# Patient Record
Sex: Female | Born: 1970
Health system: Southern US, Community
[De-identification: ages and names within clinical notes are randomized; demographics above are authoritative.]

## PROBLEM LIST (undated history)

## (undated) DIAGNOSIS — R609 Edema, unspecified: Secondary | ICD-10-CM

## (undated) DIAGNOSIS — Z8742 Personal history of other diseases of the female genital tract: Secondary | ICD-10-CM

## (undated) DIAGNOSIS — E785 Hyperlipidemia, unspecified: Secondary | ICD-10-CM

## (undated) DIAGNOSIS — R87629 Unspecified abnormal cytological findings in specimens from vagina: Secondary | ICD-10-CM

## (undated) DIAGNOSIS — Z8619 Personal history of other infectious and parasitic diseases: Secondary | ICD-10-CM

## (undated) DIAGNOSIS — A749 Chlamydial infection, unspecified: Principal | ICD-10-CM

## (undated) DIAGNOSIS — M51369 Other intervertebral disc degeneration, lumbar region without mention of lumbar back pain or lower extremity pain: Secondary | ICD-10-CM

## (undated) DIAGNOSIS — M5126 Other intervertebral disc displacement, lumbar region: Secondary | ICD-10-CM

## (undated) DIAGNOSIS — M5136 Other intervertebral disc degeneration, lumbar region: Secondary | ICD-10-CM

## (undated) DIAGNOSIS — N921 Excessive and frequent menstruation with irregular cycle: Secondary | ICD-10-CM

## (undated) DIAGNOSIS — I10 Essential (primary) hypertension: Secondary | ICD-10-CM

## (undated) HISTORY — DX: Essential (primary) hypertension: I10

## (undated) HISTORY — DX: Personal history of other diseases of the female genital tract: Z87.42

## (undated) HISTORY — DX: Other intervertebral disc degeneration, lumbar region without mention of lumbar back pain or lower extremity pain: M51.369

## (undated) HISTORY — DX: Hyperlipidemia, unspecified: E78.5

## (undated) HISTORY — DX: Chlamydial infection, unspecified: A74.9

## (undated) HISTORY — DX: Other intervertebral disc degeneration, lumbar region: M51.36

## (undated) HISTORY — DX: Excessive and frequent menstruation with irregular cycle: N92.1

## (undated) HISTORY — DX: Other intervertebral disc displacement, lumbar region: M51.26

## (undated) HISTORY — DX: Unspecified abnormal cytological findings in specimens from vagina: R87.629

## (undated) HISTORY — DX: Edema, unspecified: R60.9

## (undated) HISTORY — DX: Personal history of other infectious and parasitic diseases: Z86.19

---

## 1995-09-22 HISTORY — PX: TUBAL LIGATION: SHX77

## 2008-05-21 ENCOUNTER — Other Ambulatory Visit: Admission: RE | Admit: 2008-05-21 | Discharge: 2008-05-21 | Payer: Self-pay | Admitting: Obstetrics and Gynecology

## 2009-05-24 ENCOUNTER — Other Ambulatory Visit: Admission: RE | Admit: 2009-05-24 | Discharge: 2009-05-24 | Payer: Self-pay | Admitting: Obstetrics and Gynecology

## 2010-06-02 ENCOUNTER — Other Ambulatory Visit: Admission: RE | Admit: 2010-06-02 | Discharge: 2010-06-02 | Payer: Self-pay | Admitting: Obstetrics and Gynecology

## 2010-06-19 ENCOUNTER — Ambulatory Visit (HOSPITAL_COMMUNITY): Admission: RE | Admit: 2010-06-19 | Discharge: 2010-06-19 | Payer: Self-pay | Admitting: Obstetrics & Gynecology

## 2010-07-02 ENCOUNTER — Ambulatory Visit (HOSPITAL_COMMUNITY): Admission: RE | Admit: 2010-07-02 | Discharge: 2010-07-02 | Payer: Self-pay | Admitting: Obstetrics & Gynecology

## 2011-01-19 ENCOUNTER — Other Ambulatory Visit: Payer: Self-pay | Admitting: Obstetrics and Gynecology

## 2011-01-19 ENCOUNTER — Other Ambulatory Visit (HOSPITAL_COMMUNITY): Payer: Self-pay

## 2011-01-19 DIAGNOSIS — Z09 Encounter for follow-up examination after completed treatment for conditions other than malignant neoplasm: Secondary | ICD-10-CM

## 2011-02-11 ENCOUNTER — Encounter (HOSPITAL_COMMUNITY): Payer: Self-pay

## 2011-02-25 ENCOUNTER — Ambulatory Visit (HOSPITAL_COMMUNITY)
Admission: RE | Admit: 2011-02-25 | Discharge: 2011-02-25 | Disposition: A | Discharge status: Home / Self Care | Payer: 59 | Source: Ambulatory Visit | Attending: Obstetrics and Gynecology | Admitting: Obstetrics and Gynecology

## 2011-02-25 DIAGNOSIS — Z09 Encounter for follow-up examination after completed treatment for conditions other than malignant neoplasm: Secondary | ICD-10-CM

## 2011-02-25 DIAGNOSIS — R928 Other abnormal and inconclusive findings on diagnostic imaging of breast: Secondary | ICD-10-CM | POA: Insufficient documentation

## 2011-05-19 ENCOUNTER — Emergency Department (HOSPITAL_COMMUNITY)
Admission: EM | Admit: 2011-05-19 | Discharge: 2011-05-19 | Disposition: A | Discharge status: Home / Self Care | Payer: 59 | Attending: Emergency Medicine | Admitting: Emergency Medicine

## 2011-05-19 ENCOUNTER — Encounter: Payer: Self-pay | Admitting: *Deleted

## 2011-05-19 DIAGNOSIS — R071 Chest pain on breathing: Secondary | ICD-10-CM | POA: Insufficient documentation

## 2011-05-19 DIAGNOSIS — Y92410 Unspecified street and highway as the place of occurrence of the external cause: Secondary | ICD-10-CM | POA: Insufficient documentation

## 2011-05-19 DIAGNOSIS — R0789 Other chest pain: Secondary | ICD-10-CM

## 2011-05-19 MED ORDER — KETOROLAC TROMETHAMINE 30 MG/ML IJ SOLN
30.0000 mg | Freq: Once | INTRAMUSCULAR | Status: AC
  Administered 2011-05-19: 30 mg via INTRAVENOUS
  Filled 2011-05-19: qty 1

## 2011-05-19 MED ORDER — ONDANSETRON HCL 4 MG/2ML IJ SOLN
4.0000 mg | Freq: Once | INTRAMUSCULAR | Status: AC
  Administered 2011-05-19: 4 mg via INTRAVENOUS
  Filled 2011-05-19: qty 2

## 2011-05-19 MED ORDER — NAPROXEN 500 MG PO TABS: 500.0000 mg | ORAL_TABLET | Freq: Two times a day (BID) | ORAL | Status: AC

## 2011-05-19 MED ORDER — CYCLOBENZAPRINE HCL 10 MG PO TABS: 10.0000 mg | ORAL_TABLET | Freq: Two times a day (BID) | ORAL | Status: AC | PRN

## 2011-05-19 NOTE — Discharge Instructions (Signed)
Pain and chest wall is related to the underlying muscles. Will be sore for several days. Medications for pain and muscle spasm. Ice pack to sore areas. Followup your doctor
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
## 2011-06-04 ENCOUNTER — Encounter: Payer: Self-pay | Admitting: Family Medicine

## 2011-06-04 ENCOUNTER — Ambulatory Visit (INDEPENDENT_AMBULATORY_CARE_PROVIDER_SITE_OTHER): Payer: 59 | Admitting: Family Medicine

## 2011-06-04 ENCOUNTER — Other Ambulatory Visit (HOSPITAL_COMMUNITY)
Admission: RE | Admit: 2011-06-04 | Discharge: 2011-06-04 | Disposition: A | Discharge status: Home / Self Care | Payer: 59 | Source: Ambulatory Visit | Attending: Obstetrics and Gynecology | Admitting: Obstetrics and Gynecology

## 2011-06-04 VITALS — BP 120/80 | HR 77 | Resp 16 | Ht 62.0 in | Wt 232.8 lb

## 2011-06-04 DIAGNOSIS — Z01419 Encounter for gynecological examination (general) (routine) without abnormal findings: Secondary | ICD-10-CM | POA: Insufficient documentation

## 2011-06-04 DIAGNOSIS — M545 Low back pain, unspecified: Secondary | ICD-10-CM

## 2011-06-04 DIAGNOSIS — M542 Cervicalgia: Secondary | ICD-10-CM

## 2011-06-04 DIAGNOSIS — S20219A Contusion of unspecified front wall of thorax, initial encounter: Secondary | ICD-10-CM

## 2011-06-04 DIAGNOSIS — E669 Obesity, unspecified: Secondary | ICD-10-CM

## 2011-06-04 NOTE — Patient Instructions (Addendum)
For your breast, use ice if needed  For your neck and lower back, this is a muscle strain, you can continue the Naprosyn, use a heating pad on lower back and try stretching, if you are still having pain in 1 week call and I will order x-rays. For your weight , I recommend 30 minutes aerobic exercise 3x a week  Eat more veggies and low carb diet, decrease the sugar  Decrease the soda by 1 a day, decrease 1 tea a day, add water in its place  Eat at least 1 veggie or fruit with each meal Read on [employer]/ Phentermine  Please send a copy of your labs when you get your [place] labs done Return 8 weeks
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
## 2011-06-05 DIAGNOSIS — M545 Low back pain, unspecified: Secondary | ICD-10-CM | POA: Insufficient documentation

## 2011-06-05 DIAGNOSIS — M542 Cervicalgia: Secondary | ICD-10-CM | POA: Insufficient documentation

## 2011-06-05 DIAGNOSIS — S20219A Contusion of unspecified front wall of thorax, initial encounter: Secondary | ICD-10-CM | POA: Insufficient documentation

## 2011-06-05 NOTE — Assessment & Plan Note (Signed)
Lumbar strain no red flags on exam,for persistent pain will obtain images of lumbar spine
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
## 2011-06-05 NOTE — Assessment & Plan Note (Signed)
We discussed some of her eating habits as well as weight loss. At this time I want to see her get some effort toward changing her diet and exercise before prescribing any type of weight loss supplement

## 2011-07-27 ENCOUNTER — Encounter: Payer: Self-pay | Admitting: Family Medicine

## 2011-07-27 ENCOUNTER — Ambulatory Visit (INDEPENDENT_AMBULATORY_CARE_PROVIDER_SITE_OTHER): Payer: 59 | Admitting: Family Medicine

## 2011-07-27 VITALS — BP 108/68 | HR 83 | Resp 16 | Wt 232.1 lb

## 2011-07-27 DIAGNOSIS — S20219A Contusion of unspecified front wall of thorax, initial encounter: Secondary | ICD-10-CM

## 2011-07-27 DIAGNOSIS — M545 Low back pain, unspecified: Secondary | ICD-10-CM

## 2011-07-27 DIAGNOSIS — E669 Obesity, unspecified: Secondary | ICD-10-CM

## 2011-07-27 DIAGNOSIS — M542 Cervicalgia: Secondary | ICD-10-CM
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
## 2011-07-27 NOTE — Assessment & Plan Note (Signed)
Much improved patient no longer needing any muscle relaxants or anti-inflammatories.
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
## 2011-07-27 NOTE — Patient Instructions (Addendum)
We will set you up for a Mammogram  I will send you for an MRI of your back  Use the anti-inflammatories as needed  Bring a copy of your labs to the next visit
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
## 2011-08-14 ENCOUNTER — Ambulatory Visit (HOSPITAL_COMMUNITY): Payer: 59 | Attending: Family Medicine

## 2011-08-17 ENCOUNTER — Other Ambulatory Visit: Payer: Self-pay | Admitting: Family Medicine

## 2011-08-17 ENCOUNTER — Telehealth: Payer: Self-pay | Admitting: Family Medicine

## 2011-08-17 DIAGNOSIS — Z139 Encounter for screening, unspecified: Secondary | ICD-10-CM

## 2011-08-24 ENCOUNTER — Ambulatory Visit (HOSPITAL_COMMUNITY)
Admission: RE | Admit: 2011-08-24 | Discharge: 2011-08-24 | Disposition: A | Discharge status: Home / Self Care | Payer: 59 | Source: Ambulatory Visit | Attending: Family Medicine | Admitting: Family Medicine

## 2011-08-24 DIAGNOSIS — Z139 Encounter for screening, unspecified: Secondary | ICD-10-CM

## 2011-08-24 DIAGNOSIS — M5124 Other intervertebral disc displacement, thoracic region: Secondary | ICD-10-CM | POA: Insufficient documentation

## 2011-08-24 DIAGNOSIS — M545 Low back pain, unspecified: Secondary | ICD-10-CM

## 2011-08-24 DIAGNOSIS — M5126 Other intervertebral disc displacement, lumbar region: Secondary | ICD-10-CM | POA: Insufficient documentation

## 2011-09-29 ENCOUNTER — Ambulatory Visit (INDEPENDENT_AMBULATORY_CARE_PROVIDER_SITE_OTHER): Payer: 59 | Admitting: Family Medicine

## 2011-09-29 ENCOUNTER — Other Ambulatory Visit: Payer: Self-pay | Admitting: Family Medicine

## 2011-09-29 ENCOUNTER — Encounter: Payer: Self-pay | Admitting: Family Medicine

## 2011-09-29 ENCOUNTER — Ambulatory Visit (HOSPITAL_COMMUNITY)
Admission: RE | Admit: 2011-09-29 | Discharge: 2011-09-29 | Disposition: A | Discharge status: Home / Self Care | Payer: 59 | Source: Ambulatory Visit | Attending: Family Medicine | Admitting: Family Medicine

## 2011-09-29 VITALS — BP 112/78 | HR 82 | Resp 16 | Ht 62.0 in | Wt 230.4 lb

## 2011-09-29 DIAGNOSIS — M545 Low back pain, unspecified: Secondary | ICD-10-CM

## 2011-09-29 DIAGNOSIS — M542 Cervicalgia: Secondary | ICD-10-CM

## 2011-09-29 DIAGNOSIS — M5030 Other cervical disc degeneration, unspecified cervical region: Secondary | ICD-10-CM | POA: Insufficient documentation

## 2011-09-29 DIAGNOSIS — E669 Obesity, unspecified: Secondary | ICD-10-CM

## 2011-09-29 LAB — CBC
HCT: 38.3 % (ref 36.0–46.0)
Hemoglobin: 12.3 g/dL (ref 12.0–15.0)
MCV: 89.5 fL (ref 78.0–100.0)
RBC: 4.28 MIL/uL (ref 3.87–5.11)
WBC: 7.8 10*3/uL (ref 4.0–10.5)

## 2011-09-29 LAB — BASIC METABOLIC PANEL
Potassium: 4.4 mEq/L (ref 3.5–5.3)
Sodium: 137 mEq/L (ref 135–145)

## 2011-09-29 LAB — LIPID PANEL
Total CHOL/HDL Ratio: 4.2 Ratio
VLDL: 22 mg/dL (ref 0–40)

## 2011-09-29 MED ORDER — TRAMADOL HCL 50 MG PO TABS: 50.0000 mg | ORAL_TABLET | Freq: Three times a day (TID) | ORAL | Status: AC | PRN

## 2011-09-29 MED ORDER — DICLOFENAC SODIUM 1 % TD GEL: 1.0000 "application " | Freq: Four times a day (QID) | TRANSDERMAL | Status: DC | PRN

## 2011-09-29 NOTE — Patient Instructions (Signed)
For your back pain use the ultram for severe pain,  Use the voltaren gel You will be referred to physical therapy for your back and neck You will be referred to a spine specialist For your neck-get the x-rays, I will call with results We will call with results of your labs
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
## 2011-09-29 NOTE — Assessment & Plan Note (Signed)
Continue to encourage weight loss. Patient will did not have labs done at her job this year as they were not offering it this year. She will have fasting lipid panel performed

## 2011-09-29 NOTE — Assessment & Plan Note (Addendum)
Patient's neck pain is actually deteriorated since her last visit 2 months ago. I believe she is likely getting some strain and spasm however she does have tenderness over the C-spine therefore will obtain plain x-rays. I will plan to send her to a spine specialist for second opinion and review. In the meantime she will be given topical) gel and she did not respond very well mentally to muscle relaxants

## 2011-09-30 ENCOUNTER — Telehealth: Payer: Self-pay | Admitting: Family Medicine

## 2011-09-30 DIAGNOSIS — M545 Low back pain, unspecified: Secondary | ICD-10-CM

## 2011-09-30 DIAGNOSIS — M542 Cervicalgia: Secondary | ICD-10-CM
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
## 2011-10-07 ENCOUNTER — Ambulatory Visit (HOSPITAL_COMMUNITY)
Admission: RE | Admit: 2011-10-07 | Discharge: 2011-10-07 | Disposition: A | Discharge status: Home / Self Care | Payer: 59 | Source: Ambulatory Visit | Attending: Family Medicine | Admitting: Family Medicine

## 2011-10-07 DIAGNOSIS — M436 Torticollis: Secondary | ICD-10-CM | POA: Insufficient documentation

## 2011-10-07 DIAGNOSIS — IMO0001 Reserved for inherently not codable concepts without codable children: Secondary | ICD-10-CM | POA: Insufficient documentation

## 2011-10-07 DIAGNOSIS — M545 Low back pain, unspecified: Secondary | ICD-10-CM | POA: Insufficient documentation

## 2011-10-07 DIAGNOSIS — M5380 Other specified dorsopathies, site unspecified: Secondary | ICD-10-CM | POA: Insufficient documentation

## 2011-10-07 DIAGNOSIS — M6281 Muscle weakness (generalized): Secondary | ICD-10-CM | POA: Insufficient documentation

## 2011-10-07 DIAGNOSIS — M542 Cervicalgia: Secondary | ICD-10-CM | POA: Insufficient documentation
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
## 2011-10-12 ENCOUNTER — Ambulatory Visit (HOSPITAL_COMMUNITY)
Admission: RE | Admit: 2011-10-12 | Discharge: 2011-10-12 | Disposition: A | Discharge status: Home / Self Care | Payer: 59 | Source: Ambulatory Visit | Attending: Family Medicine | Admitting: Family Medicine

## 2011-10-12 DIAGNOSIS — M5380 Other specified dorsopathies, site unspecified: Secondary | ICD-10-CM

## 2011-10-12 DIAGNOSIS — M436 Torticollis: Secondary | ICD-10-CM

## 2011-10-12 NOTE — Progress Notes (Signed)
Physical Therapy Treatment Patient Details  Name: Gabrielle Richards MRN: [id number] Date of Birth: 19-Sep-1971 Today's Date: 10/12/2011 Time: 8657-8469 Time Calculation (min): 44 min Charges: 44 TE Visit#: 2  of 16   Re-eval: 11/06/11    Subjective: Symptoms/Limitations Symptoms: "Like a toothache this weekend neck and back"  Driving especially hurts neck.   Pain Assessment Currently in Pain?: Yes Pain Score:   5 Pain Location: Back Pain Orientation: Lower;Right Pain Type: Chronic pain Multiple Pain Sites: Yes  Exercise/Treatments Stretches Upper Trapezius Stretch: 2 reps;30 seconds;Limitations Upper Trapezius Stretch Limitations: bil Neck Exercises Neck Retraction: 10 reps;Seated Shoulder Extension: Both;Other reps (comment);Standing;Theraband Theraband Level (Shoulder Extension): Level 2 (Red) Row: Both;Other reps (comment);Theraband Theraband Level (Row): Level 2 (Red) Scapular Retraction: AROM;Both;10 reps;Seated Additional Neck Exercises UBE (Upper Arm Bike): 2' forward, 2' backwards   Stretches Single Knee to Chest Stretch: 2 reps;30 seconds;Limitations Single Knee to Chest Stretch Limitations: bil, have to do passively secondary to patient feels pulling into her neck when done with her own arms.   Lumbar Exercises Scapular Retraction: AROM;Both;10 reps;Seated Row: Both;Other reps (comment);Theraband Theraband Level (Row): Level 2 (Red) Shoulder Extension: Both;Other reps (comment);Standing;Theraband Theraband Level (Shoulder Extension): Level 2 (Red) Stability Bridge: 10 reps Ab Set: 10 reps;5 seconds Machine Exercises Tread Mill: 5' @ 0.8 mph UBE (Upper Arm Bike): 2' forward, 2' backwards   Physical Therapy Assessment and Plan Clinical Impression Statement: The patient was unable to perform most exercises within the full ROM, but could perform most in partial ROM.  She is going to heat at home her low back and her neck.   PT Plan: add lower trunk rotation  next session and bent knee raises, assess pain after last treatment and tolerance of HEP    Problem List Patient Active Problem List  Diagnoses  . Neck pain  . Lumbar back pain  . Obesity  . Muscle weakness (generalized)  . Limitation of joint motion of low back  . Limitation of joint motion of neck   PT Plan of Care PT Home Exercise Plan: see scanned report.    Rollene Rotunda Matilde Pottenger, PT, DPT  10/12/2011, 6:45 PM

## 2011-10-13 ENCOUNTER — Inpatient Hospital Stay (HOSPITAL_COMMUNITY): Admission: RE | Admit: 2011-10-13 | Payer: 59 | Source: Ambulatory Visit

## 2011-10-14 ENCOUNTER — Ambulatory Visit (HOSPITAL_COMMUNITY): Payer: 59 | Admitting: *Deleted

## 2011-10-19 ENCOUNTER — Ambulatory Visit (HOSPITAL_COMMUNITY)
Admission: RE | Admit: 2011-10-19 | Discharge: 2011-10-19 | Disposition: A | Discharge status: Home / Self Care | Payer: 59 | Source: Ambulatory Visit | Attending: Family Medicine | Admitting: Family Medicine

## 2011-10-19 DIAGNOSIS — M5380 Other specified dorsopathies, site unspecified: Secondary | ICD-10-CM

## 2011-10-19 DIAGNOSIS — M436 Torticollis: Secondary | ICD-10-CM

## 2011-10-19 NOTE — Progress Notes (Signed)
Physical Therapy Treatment Patient Details  Name: Gabrielle Richards MRN: [id number] Date of Birth: Mar 27, 1971  Today's Date: 10/19/2011 Time: 4098-1191 Time Calculation (min): 51 min Charges: 51 TE Visit#: 3  of 16   Re-eval: 11/06/11    Subjective: Symptoms/Limitations Symptoms: Has been doing HEP as able.   Pain Assessment Currently in Pain?: Yes Pain Score:   5 Pain Location: Back Pain Orientation: Right;Lower Pain Type: Chronic pain Pain Radiating Towards: none today  Exercise/Treatments Stretches Corner Stretch: 3 reps;10 seconds Neck Exercises Shoulder Extension: Both;Other reps (comment);Standing;Theraband Theraband Level (Shoulder Extension): Level 2 (Red) Row: Both;Other reps (comment);Theraband Theraband Level (Row): Level 2 (Red) Scapular Retraction: AROM;Both;10 reps;Seated Additional Neck Exercises UBE (Upper Arm Bike): 2' forward, 2' backwards   Stretches Passive Hamstring Stretch: 2 reps;30 seconds Single Knee to Chest Stretch: 2 reps;30 seconds;Limitations Single Knee to Chest Stretch Limitations: bil, have to do passively secondary to patient feels pulling into her neck when done with her own arms.   Lower Trunk Rotation: 5 reps;10 seconds Lumbar Exercises Scapular Retraction: AROM;Both;10 reps;Seated Row: Both;Other reps (comment);Theraband Theraband Level (Row): Level 2 (Red) Shoulder Extension: Both;Other reps (comment);Standing;Theraband Theraband Level (Shoulder Extension): Level 2 (Red) Stability Bridge: 10 reps Ab Set: 10 reps;5 seconds Machine Exercises Tread Mill: 5' @ 1.1 mph UBE (Upper Arm Bike): 2' forward, 2' backwards   Physical Therapy Assessment and Plan Clinical Impression Statement: The patient tolerated new ther ex well.  Wants to try to lose weight and get back into working out.  We calculated her target HR and will attempt to get to lower end of target HR next visit on the treadmill.   PT Plan: check to see if we can get into  target HR zone (106-140 bpm) on the treadmill next session.  Patient wants to try to start working out and would like to see how intense she has to go walking to reach her target HR.      Problem List Patient Active Problem List  Diagnoses  . Neck pain  . Lumbar back pain  . Obesity  . Muscle weakness (generalized)  . Limitation of joint motion of low back  . Limitation of joint motion of neck    General Behavior During Session: [facility] for tasks performed Cognition: [facility] for tasks performed PT Plan of Care PT Home Exercise Plan: no new  Jame Seelig B. Adriel Desrosier, PT, DPT  10/19/2011, 6:37 PM

## 2011-10-21 ENCOUNTER — Ambulatory Visit (HOSPITAL_COMMUNITY)
Admission: RE | Admit: 2011-10-21 | Discharge: 2011-10-21 | Disposition: A | Discharge status: Home / Self Care | Payer: 59 | Source: Ambulatory Visit | Attending: Family Medicine | Admitting: Family Medicine

## 2011-10-21 DIAGNOSIS — M5380 Other specified dorsopathies, site unspecified: Secondary | ICD-10-CM

## 2011-10-21 DIAGNOSIS — M436 Torticollis: Secondary | ICD-10-CM

## 2011-10-21 NOTE — Progress Notes (Signed)
Physical Therapy Treatment Patient Details  Name: Gabrielle Richards MRN: [id number] Date of Birth: 01-12-1971  Today's Date: 10/21/2011 Time: 4098-1191 Time Calculation (min): 50 min Visit#: 4  of 16   Re-eval: 11/06/11  Charge: therex 46 min  Subjective: Symptoms/Limitations Symptoms: R neck down back around spinal cord, pain scale 5/10.  Got a good workout last session Pain Assessment Currently in Pain?: Yes Pain Score:   5 Pain Location: Back  Objective:   Exercise/Treatments Stretches Active Hamstring Stretch: 3 reps;30 seconds;Limitations Active Hamstring Stretch Limitations: with rope Single Knee to Chest Stretch: 2 reps;30 seconds;Limitations Single Knee to Chest Stretch Limitations: bilateral with towel Lower Trunk Rotation: 5 reps;10 seconds Lumbar Exercises Scapular Retraction: Both;15 reps;Standing;Theraband Theraband Level (Scapular Retraction): Level 3 (Green) Row: Both;15 reps;Standing;Theraband Theraband Level (Row): Level 3 (Green) Shoulder Extension: Both;15 reps;Standing;Theraband Theraband Level (Shoulder Extension): Level 3 (Green) Stability Clam: Supine;10 reps;3 seconds;Limitations Clam Limitations: with ab sets Bridge: 15 reps;Limitations Bridge Limitations: with ab sets Ab Set: 10 reps;5 seconds Wall Slides: 5 seconds;5 reps;Limitations Wall Slides Limitations: with ab sets Machine Exercises Tread Mill: 6' @ 1.6; 3' in HR 121, 6' in HR=118 UBE (Upper Arm Bike): 4' backwards @ 1.5     Physical Therapy Assessment and Plan PT Assessment and Plan Clinical Impression Statement: Pt able to acheive HR of 121 on treadmill today.  Added new supine therex for core strengthening/stability with min cueing for technique.  Pt tolerated well towards total therex.   PT Plan: Continue treadmill for target HR (106-140 bpm), progress lumbar/cervical strength, begin prone hip ext and supine SLR with ab set next session.      Goals    Problem List Patient  Active Problem List  Diagnoses  . Neck pain  . Lumbar back pain  . Obesity  . Muscle weakness (generalized)  . Limitation of joint motion of low back  . Limitation of joint motion of neck    PT - End of Session Activity Tolerance: Patient tolerated treatment well General Behavior During Session: [facility] for tasks performed Cognition: [facility] for tasks performed  Juel Burrow 10/21/2011, 5:44 PM

## 2011-10-22 ENCOUNTER — Ambulatory Visit (HOSPITAL_COMMUNITY)
Admission: RE | Admit: 2011-10-22 | Discharge: 2011-10-22 | Disposition: A | Discharge status: Home / Self Care | Payer: 59 | Source: Ambulatory Visit | Attending: Family Medicine | Admitting: Family Medicine

## 2011-10-22 DIAGNOSIS — M5380 Other specified dorsopathies, site unspecified: Secondary | ICD-10-CM

## 2011-10-22 DIAGNOSIS — M436 Torticollis: Secondary | ICD-10-CM

## 2011-10-22 NOTE — Progress Notes (Signed)
Physical Therapy Treatment Patient Details  Name: THAMAR HOLIK MRN: [id number] Date of Birth: [birth year]  Today's Date: 10/22/2011 Time: 9528-4132 Time Calculation (min): 42 min Charges: 42 TE Visit#: 5  of 16   Re-eval: 11/06/11    Subjective: Symptoms/Limitations Symptoms: Sore after last session.  "She worked me hard" Pain Assessment Currently in Pain?: Yes Pain Score:   6 Pain Location: Back Pain Orientation: Right;Lower Pain Type: Chronic pain  Exercise/Treatments Neck Exercises Shoulder Extension: Both;15 reps;Standing;Theraband Theraband Level (Shoulder Extension): Level 3 (Green) Row: Both;15 reps;Standing;Theraband Theraband Level (Row): Level 3 (Green) Scapular Retraction: Both;15 reps;Standing;Theraband Theraband Level (Scapular Retraction): Level 3 (Green) Additional Neck Exercises UBE (Upper Arm Bike): 4' backwards @ 1.5 Stretches Active Hamstring Stretch: 30 seconds;Limitations;2 reps Active Hamstring Stretch Limitations: with rope right leg only Passive Hamstring Stretch: 2 reps;30 seconds Single Knee to Chest Stretch: 2 reps;30 seconds;Limitations Single Knee to Chest Stretch Limitations: bilateral with therapist's assist Lower Trunk Rotation: 5 reps;10 seconds Lumbar Exercises Scapular Retraction: Both;15 reps;Standing;Theraband Theraband Level (Scapular Retraction): Level 3 (Green) Row: Both;15 reps;Standing;Theraband Theraband Level (Row): Level 3 (Green) Shoulder Extension: Both;15 reps;Standing;Theraband Theraband Level (Shoulder Extension): Level 3 (Green) Stability Bridge: Limitations;10 reps Bridge Limitations: with ab sets Bent Knee Raise: 10 reps Straight Leg Raise: 5 reps;Supine;Limitations Straight Leg Raises Limitations: bil with ab set Leg Raise: Right;Left;5 reps;Limitations Leg Raises Limitations: prone over 1 pillow alternating legs.   Wall Slides: 5 reps;Limitations Wall Slides Limitations: with ab set 8 reps Machine  Exercises Tread Mill: 6' @ 1.6; 3' in HR 121, 6' in HR=118 UBE (Upper Arm Bike): 4' backwards @ 1.5  Physical Therapy Assessment and Plan Clinical Impression Statement: The patient tolerated new exercises well.  Did not do as much cervical exercises and stretches today.   PT Plan: Continue to do treadmill for target HR of 106-140 bpm, do more cervical strengthening next session (chin tucks, corner stretch, UT stretch).      Problem List Patient Active Problem List  Diagnoses  . Neck pain  . Lumbar back pain  . Obesity  . Muscle weakness (generalized)  . Limitation of joint motion of low back  . Limitation of joint motion of neck   General Behavior During Session: [facility] for tasks performed Cognition: [facility] for tasks performed PT Plan of Care PT Home Exercise Plan: no new  Cassey Hurrell B. Kruz Chiu, PT, DPT  10/22/2011, 6:59 PM

## 2011-10-26 ENCOUNTER — Ambulatory Visit (HOSPITAL_COMMUNITY)
Admission: RE | Admit: 2011-10-26 | Discharge: 2011-10-26 | Disposition: A | Discharge status: Home / Self Care | Payer: 59 | Source: Ambulatory Visit | Attending: Family Medicine | Admitting: Family Medicine

## 2011-10-26 DIAGNOSIS — M545 Low back pain, unspecified: Secondary | ICD-10-CM | POA: Insufficient documentation

## 2011-10-26 DIAGNOSIS — M6281 Muscle weakness (generalized): Secondary | ICD-10-CM | POA: Insufficient documentation

## 2011-10-26 DIAGNOSIS — IMO0001 Reserved for inherently not codable concepts without codable children: Secondary | ICD-10-CM | POA: Insufficient documentation

## 2011-10-26 DIAGNOSIS — M542 Cervicalgia: Secondary | ICD-10-CM | POA: Insufficient documentation

## 2011-10-26 NOTE — Progress Notes (Signed)
Physical Therapy Treatment Patient Details  Name: Gabrielle Richards MRN: [id number] Date of Birth: [birth year]  Today's Date: 10/26/2011 Time: 4098-1191 Time Calculation (min): 39 min Visit#: 6  of 16   Re-eval: 11/06/11 Charges:  therex 38'  Subjective: Symptoms/Limitations Symptoms: Pt. states she has worked out today at work; been [employer].  Pt states her neck is bothering her more than her back. Pain Assessment Currently in Pain?: Yes Pain Score:   4 Pain Location: Back Pain Orientation: Right;Lower   Exercise/Treatments Stretches Upper Trapezius Stretch: 2 reps;30 seconds;Limitations Upper Trapezius Stretch Limitations: bil Corner Stretch: 3 reps;30 seconds Neck Exercises Prone chin tuck head lift 10X Prone extension, and rows 10 X Prone alt UE lifts 5X each Shoulder Extension: Both;15 reps;Standing;Theraband Theraband Level (Shoulder Extension): Level 3 (Green) Row: Both;15 reps;Standing;Theraband Theraband Level (Row): Level 3 (Green) Scapular Retraction: Both;15 reps;Standing;Theraband Theraband Level (Scapular Retraction): Level 3 (Green) Stability Bridge: 15 reps;Limitations Bridge Limitations: with ab Archivist Knee Raise: 10 reps Machine Exercises Tread Mill: 8' 1.6-1. UBE (Upper Arm Bike): 4' backwards @ 1.5     Physical Therapy Assessment and Plan PT Assessment and Plan Clinical Impression Statement: Focused more on cervical exercises and stretches today.  Added prone strengthening exercises for scapular/cervical muscles.  Given tband and written instructions for HEP. PT Plan: Continue to work on pain reduction and progressing toward goals.    Problem List Patient Active Problem List  Diagnoses  . Neck pain  . Lumbar back pain  . Obesity  . Muscle weakness (generalized)  . Limitation of joint motion of low back  . Limitation of joint motion of neck    PT - End of Session Activity Tolerance: Patient tolerated treatment  well General Behavior During Session: [facility] for tasks performed Cognition: [facility] for tasks performed  Soha Thorup B. Bascom Levels, PTA 10/26/2011, 5:37 PM

## 2011-10-28 ENCOUNTER — Ambulatory Visit (HOSPITAL_COMMUNITY)
Admission: RE | Admit: 2011-10-28 | Discharge: 2011-10-28 | Disposition: A | Discharge status: Home / Self Care | Payer: 59 | Source: Ambulatory Visit | Attending: Family Medicine | Admitting: Family Medicine

## 2011-10-28 DIAGNOSIS — M5380 Other specified dorsopathies, site unspecified: Secondary | ICD-10-CM

## 2011-10-28 DIAGNOSIS — M436 Torticollis: Secondary | ICD-10-CM

## 2011-10-28 NOTE — Patient Instructions (Signed)
n

## 2011-10-28 NOTE — Progress Notes (Signed)
Physical Therapy Treatment Patient Details  Name: Gabrielle Richards MRN: [id number] Date of Birth: 1970-11-11  Today's Date: 10/28/2011 Time: 4098-1191 Time Calculation (min): 44 min Charges: 44TE Visit#: 7  of 16   Re-eval: 11/06/11    Subjective: Symptoms/Limitations Symptoms: "Please take it easy on me tonight" Patient is fatigued after a long day at work.   Pain Assessment Currently in Pain?: Yes Pain Score:   3 Pain Location: Back Pain Orientation: Right;Lower Pain Type: Chronic pain  Exercise/Treatments Stretches Upper Trapezius Stretch: 2 reps;Limitations;20 seconds Upper Trapezius Stretch Limitations: bil Corner Stretch: 2 reps;20 seconds (tighter mildly with left sidebend.  ) Neck Exercises Neck Retraction: Limitations Neck Retraction Limitations: prone chin tuck head lift 10X Shoulder Extension: Both;15 reps;Standing;Theraband Theraband Level (Shoulder Extension): Level 3 (Green) Row: Both;15 reps;Standing;Theraband Theraband Level (Row): Level 3 (Green) Scapular Retraction: Both;15 reps;Standing;Theraband Theraband Level (Scapular Retraction): Level 3 (Green) Upper Extremity Lift: Limitations Uppder Extremity Lift Limitations: alt UE lifts 1x10, w back lifts in prone 1 x 10 Additional Neck Exercises UBE (Upper Arm Bike): 2' backwards, 2' forwards @ 2.0  Stretches Passive Hamstring Stretch: 2 reps;30 seconds Single Knee to Chest Stretch: 2 reps;30 seconds;Limitations Single Knee to Chest Stretch Limitations: bilateral with therapist's assist Lower Trunk Rotation: 5 reps;10 seconds Lumbar Exercises Scapular Retraction: Both;15 reps;Standing;Theraband Theraband Level (Scapular Retraction): Level 3 (Green) Row: Both;15 reps;Standing;Theraband Theraband Level (Row): Level 3 (Green) Shoulder Extension: Both;15 reps;Standing;Theraband Theraband Level (Shoulder Extension): Level 3 (Green) Stability Bridge: 15 reps;Limitations Bridge Limitations: with ab  sets Bent Knee Raise: 15 reps Straight Leg Raise: 5 reps;Supine;Limitations Straight Leg Raises Limitations: bil with ab set hovering Leg Raise: Right;Left;Limitations;10 reps Machine Exercises Tread Mill: 6' @ 1.6-1.8  UBE (Upper Arm Bike): 2' backwards, 2' forwards @ 2.0  Physical Therapy Assessment and Plan Clinical Impression Statement: The patient is tolerating new exercises well.  She has increased her mobility in both hamstring flexibility and low back rotation as visibly observed by PT today.   PT Plan: Continue PRE for abdominal strength and cervical mobility and strength.  Start cervical isometrics and d/c t-band for scap secondary to patient should be pefroming for HEP at home.  Start lifting education and functional squats again.    Problem List Patient Active Problem List  Diagnoses  . Neck pain  . Lumbar back pain  . Obesity  . Muscle weakness (generalized)  . Limitation of joint motion of low back  . Limitation of joint motion of neck   PT - End of Session Activity Tolerance: Patient tolerated treatment well General Behavior During Session: [facility] for tasks performed Cognition: [facility] for tasks performed  [name] B. Britiney Blahnik, PT, DPT  10/28/2011, 7:17 PM

## 2011-11-02 ENCOUNTER — Ambulatory Visit (HOSPITAL_COMMUNITY)
Admission: RE | Admit: 2011-11-02 | Discharge: 2011-11-02 | Disposition: A | Discharge status: Home / Self Care | Payer: 59 | Source: Ambulatory Visit | Attending: Family Medicine | Admitting: Family Medicine

## 2011-11-02 NOTE — Progress Notes (Signed)
Physical Therapy Treatment Patient Details  Name: Gabrielle Richards MRN: [id number] Date of Birth: 12-18-1970  Today's Date: 11/02/2011 Time: 5621-3086 Time Calculation (min): 43 min Visit#: 8  of 16   Re-eval: 11/06/11 Charges: Therex x 38'  Subjective: Symptoms/Limitations Symptoms: It hurts the worst when I'm sitting. I feel like I'm getting better. Pain Assessment Currently in Pain?: Yes Pain Score:   5 Pain Location: Back Pain Orientation: Right;Lower   Exercise/Treatments Stretches Passive Hamstring Stretch: 2 reps;30 seconds Single Knee to Chest Stretch: 2 reps;30 seconds;Limitations Single Knee to Chest Stretch Limitations: bilateral with therapist's assist Lower Trunk Rotation: 5 reps;10 seconds Lumbar Exercises Scapular Retraction: Both;15 reps;Standing;Theraband Theraband Level (Scapular Retraction): Level 3 (Green) Row: Both;15 reps;Standing;Theraband Theraband Level (Row): Level 3 (Green) Shoulder Extension: Both;15 reps;Standing;Theraband Theraband Level (Shoulder Extension): Level 3 (Green) Stability Bridge: 15 reps;Limitations Bridge Limitations: with ab sets Bent Knee Raise: 15 reps Straight Leg Raise: 5 reps;Supine;Limitations Straight Leg Raises Limitations: bil with ab set hovering Single Arm Raise: 10 reps;Prone Leg Raise: 10 reps;Prone;Right;Single;Left Opposite Arm/Leg Raise: 5 reps;Prone Machine Exercises UBE (Upper Arm Bike): 2' backwards, 2' forwards @ 2.0  Physical Therapy Assessment and Plan PT Assessment and Plan Clinical Impression Statement: Pt completes therex well requiring minimal cueing for form. Pt is without c/o increase pain throughout session. Scapular t-band exercises completed to insure proper technique.Began prone opposite UE/LE lifts to improve back strength. Pt  reports pain decrease to 4/10 at end of session. PT Plan: Reassess next session.     Problem List Patient Active Problem List  Diagnoses  . Neck pain  . Lumbar  back pain  . Obesity  . Muscle weakness (generalized)  . Limitation of joint motion of low back  . Limitation of joint motion of neck    PT - End of Session Activity Tolerance: Patient tolerated treatment well General Behavior During Session: [facility] for tasks performed Cognition: [facility] for tasks performed  Seth Bake, PTA 11/02/2011, 5:38 PM

## 2011-11-04 ENCOUNTER — Ambulatory Visit (HOSPITAL_COMMUNITY)
Admission: RE | Admit: 2011-11-04 | Discharge: 2011-11-04 | Disposition: A | Discharge status: Home / Self Care | Payer: 59 | Source: Ambulatory Visit | Attending: Family Medicine | Admitting: Family Medicine

## 2011-11-04 DIAGNOSIS — M5380 Other specified dorsopathies, site unspecified: Secondary | ICD-10-CM

## 2011-11-04 DIAGNOSIS — M436 Torticollis: Secondary | ICD-10-CM

## 2011-11-04 NOTE — Evaluation (Signed)
Physical Therapy Re-Evaluation  Patient Details  Name: Gabrielle Richards MRN: [id number] Date of Birth: January 11, 1971  Today's Date: 11/04/2011 Time: 4098-1191 Time Calculation (min): 47 min Charges: 1 re-eval, 1 MMT, 1 ROM, 10 TE Visit#: 9  of 16   Re-eval: 12/09/11  Assessment Diagnosis: low back and neck pain  Past Surgical History:  Past Surgical History  Procedure Date  . Tubal ligation    Subjective Symptoms/Limitations Symptoms: Having a bad day, overall neck and back Pain Assessment Currently in Pain?: Yes Pain Score:   5 Pain Location: Neck Pain Orientation: Right;Lower Pain Type: Chronic pain Effect of Pain on Daily Activities: driving is still bothering her, at work towards the end of the day pain increases.    Assessment: last filed values = ( ) RUE Strength Right Shoulder Flexion:4+/5 no pulling (4/5 painful pulling) Right Shoulder ABduction:4+/5, no painful pulling (4/5 painful pulling) LUE Assessment LUE Assessment: Within Functional Limits [facility]) RLE Strength Right Hip Flexion:4-/5 (3+/5) Right Hip Extension: 4+/5 (4/5) Right Hip ABduction: 5/5 (5/5) Right Knee Flexion: 4/5 (5/5) Right Knee Extension: 4/5 (5/5) Right Ankle Dorsiflexion: 4/5 (5/5) Right Ankle Plantar Flexion: 5/5 (5/5) LLE Strength Left Hip Flexion:4+/5 (4/5) Left Hip Extension: 4/5 (4/5) Left Hip ABduction: 5/5 (5/5) Left Knee Flexion: 5/5 (5/5) Left Knee Extension: 5/5 (5/5) Left Ankle Dorsiflexion: 5/5 (5/5) Left Ankle Plantar Flexion: 5/5 Cervical AROM Cervical Flexion: decreased 10 % "a little strain" right middle neck (decreased 10%) Cervical Extension: decreased 50% pain anterior right neck. (decreased 10%) Cervical - Right Side Bend: decreased 10% some pressure on the right side (decreased 75%) Cervical - Left Side Bend: WFL no pain (decreased 50%) Cervical - Right Rotation: Decreased 5% most painful motion (decreased 50%) Cervical - Left Rotation: Decreased 5 % " a little  pull" on the right (decreased 10%) Cervical Strength Cervical Flexion: 3+/5 (3+/5) Cervical Extension: 4+/5 (3+/5) Cervical - Right Side Bend: 3+/5 (3+/5) Cervical - Left Side Bend: 4-/5 (3+/5) Cervical - Right Rotation: 4/5 (3/5) Cervical - Left Rotation: 4/5 Lumbar AROM Lumbar Flexion: decreased 5% "minor" pain (decreased 50%) Lumbar Extension: decreased 5% "center/right sting" at end ROM (decreased 25%) Lumbar - Right Side Bend: Decreased 25% (decreased 30%) Lumbar - Left Side Bend: Decreased 10% (decreased 25%) Lumbar - Right Rotation: Decreased 50% right sided rib pain (decreased 40%) Lumbar - Left Rotation: Decreased 75% (decreased 30%) Lumbar Strength Lumbar Flexion: 4/5 (NT) Lumbar Extension: 3/5 (NT)  Exercise/Treatments Machine Exercises Tread Mill: 6' @ 2.0  UBE (Upper Arm Bike): 4' backwards @ 1.0  Physical Therapy Assessment and Plan Clinical Impression Statement: Re-eval performed.  The patient has improved in both strength and ROM throughout her neck an lumbar spine.  She continues to have pain at 4-5/10 average intensity level daily and continues to be limited in her functional ablilty and her ability to workout.  She has attended 9 PT sessions and has met 4/4 STGs and  0/6 LTGs so far.   Rehab Potential: Good PT Frequency: Min 2X/week PT Duration: 4 weeks PT Treatment/Interventions: Functional mobility training;Therapeutic activities;Therapeutic exercise;Balance training;Neuromuscular re-education;Patient/family education;Other (comment) (modalities PRN for pain) PT Plan: continue 2 xs/wk x 4 more weeks, add cervical isometrics and functional squats starting lifting education next session.    Goals Home Exercise Program Pt will Perform Home Exercise Program: Independently Met PT Short Term Goals PT Short Term Goal 1: The patient will report max neck and back pain as a 5/10 or less to increase QOL and tolerane of functional activities  PT Short Term Goal 1 -  Progress: Met (4-5/10 ) PT Short Term Goal 2: The pateint will increase lumbar and cervical ROM by 10% (where applicable) throughout to increase mobility and ability to check her blind spot while driving. PT Short Term Goal 2 - Progress: Met PT Short Term Goal 3: The patient will increase her hip extension and hip flexion strength to at least 4/5 to show improved leg strength and tolerance of work tasks PT Short Term Goal 3 - Progress: Met PT Long Term Goals PT Long Term Goal 1: The pateint will report a maximal weekly pain of less than or equal to 3/10 in her low back and neck to show improved QOL and tolerance of functional activities PT Long Term Goal 1 - Progress: Progressing toward goal PT Long Term Goal 2: The patient will increase core strength to greater than or equal to 4/5 to show increased spinal stability and strength PT Long Term Goal 2 - Progress: Partly met Long Term Goal 3: The patient will increase her neck strength to at least 4/5 to show improved cervical stability and strength. Long Term Goal 3 Progress: Partly met Long Term Goal 4: The patient will increase cervical ROM to within 5% of normal limits to show improved mobility Long Term Goal 4 Progress: Partly met PT Long Term Goal 5: The patient will increase lumbar ROM to within 5% of normal to show imroved mobility Long Term Goal 5 Progress: Partly met Additional PT Long Term Goals?: Yes PT Long Term Goal 6: The pateint will be able to demonstrate correct lifting of an empty box 10 times to show improved tolerance of lifting equipment and supplies at work as well as retaining teaching of correct lifting techniques Long Term Goal 6 Progress: Other (comment) (not tested yet)  Problem List Patient Active Problem List  Diagnoses  . Neck pain  . Lumbar back pain  . Obesity  . Muscle weakness (generalized)  . Limitation of joint motion of low back  . Limitation of joint motion of neck    PT - End of Session Activity  Tolerance: Patient tolerated treatment well General Behavior During Session: [facility] for tasks performed Cognition: [facility] for tasks performed PT Plan of Care PT Home Exercise Plan: no new  Rukia Mcgillivray B. Jahmai Finelli, PT, DPT  11/04/2011, 7:19 PM  Physician Documentation Your signature is required to indicate approval of the treatment plan as stated above.  Please sign and either send electronically or make a copy of this report for your files and return this physician signed original.   Please mark one 1.__approve of plan  2. ___approve of plan with the following conditions.   ______________________________                                                          _____________________ Physician Signature  Date  

## 2011-11-05 ENCOUNTER — Telehealth: Payer: Self-pay | Admitting: Family Medicine

## 2011-11-05 NOTE — Telephone Encounter (Signed)
Noted, will have extension on therapy

## 2011-11-11 ENCOUNTER — Ambulatory Visit (HOSPITAL_COMMUNITY)
Admission: RE | Admit: 2011-11-11 | Discharge: 2011-11-11 | Disposition: A | Discharge status: Home / Self Care | Payer: 59 | Source: Ambulatory Visit | Attending: Family Medicine | Admitting: Family Medicine

## 2011-11-11 DIAGNOSIS — M436 Torticollis: Secondary | ICD-10-CM

## 2011-11-11 DIAGNOSIS — M5380 Other specified dorsopathies, site unspecified: Secondary | ICD-10-CM

## 2011-11-11 NOTE — Progress Notes (Signed)
Physical Therapy Treatment Patient Details  Name: Gabrielle Richards MRN: [id number] Date of Birth: 09-Mar-1971  Today's Date: 11/11/2011 Time: 4098-1191 Time Calculation (min): 53 min Visit#: 10  of 16   Re-eval: 12/09/11  Charge: therex: 43 min  Subjective: Symptoms/Limitations Symptoms: Intermittent pain R neck 5/10, LBP 4/10. Pain Assessment Currently in Pain?: Yes Pain Score:   4 Pain Location: Back  Objective:   Exercise/Treatments Neck Exercises Neck Lateral Flexion - Right: 5 reps;Seated;Limitations Neck Lateral Flexion - Right Limitations: isometric Neck Lateral Flexion - Left: 5 reps;Seated;Limitations Neck Lateral Flexion - Left Limitations: isometric Neck Rotation - Right: 5 reps;Seated;Limitations Neck Rotation - Right Limitations: isometric Neck Rotation - Left: 5 reps;Seated;Limitations Neck Rotation - Left Limitations: isometric Shoulder Extension: Both;15 reps;Standing;Theraband Theraband Level (Shoulder Extension): Level 3 (Green) Row: Both;15 reps;Standing;Theraband Theraband Level (Row): Level 3 (Green) Scapular Retraction: Both;15 reps;Standing;Theraband Theraband Level (Scapular Retraction): Level 3 (Green) Additional Neck Exercises UBE (Upper Arm Bike): 4' backwards @ 2.0  Stretches Passive Hamstring Stretch: 3 reps;30 seconds Single Knee to Chest Stretch: 5 reps;20 seconds Single Knee to Chest Stretch Limitations: bilateral with therapist's assist Lower Trunk Rotation: 5 reps;10 seconds Lumbar Exercises Scapular Retraction: Both;15 reps;Standing;Theraband Theraband Level (Scapular Retraction): Level 3 (Green) Row: Both;15 reps;Standing;Theraband Theraband Level (Row): Level 3 (Green) Shoulder Extension: Both;15 reps;Standing;Theraband Theraband Level (Shoulder Extension): Level 3 (Green) Stability Clam: Supine;10 reps;4 seconds Clam Limitations: with ab sets Bridge: 15 reps;Limitations Bridge Limitations: with ab sets Bent Knee Raise: 15  reps;Limitations Bent Knee Raise Limitations: with ab sets Heel Squeeze: Prone;5 reps;5 seconds Opposite Arm/Leg Raise: Prone;15 reps Functional Squats: 10 reps;Limitations Functional Squats Limitations: with manual assistance/demonstration Machine Exercises Tread Mill: 6' @ 2.0  UBE (Upper Arm Bike): 4' backwards @ 2.0     Physical Therapy Assessment and Plan PT Assessment and Plan Clinical Impression Statement: Began functional squats with demonstration and manual assistance for proper form, pt able to complete correclty following cueing.  Added isometric cervical strengthening exercises without difficulty.  Pt tolerated well towards total treatment. PT Plan: Continue with current POC, begin box lifting when functional squats are performed with good form.      Goals    Problem List Patient Active Problem List  Diagnoses  . Neck pain  . Lumbar back pain  . Obesity  . Muscle weakness (generalized)  . Limitation of joint motion of low back  . Limitation of joint motion of neck    PT - End of Session Activity Tolerance: Patient tolerated treatment well General Behavior During Session: [facility] for tasks performed Cognition: [facility] for tasks performed  Juel Burrow, PTA 11/11/2011, 5:49 PM

## 2011-11-13 NOTE — Telephone Encounter (Signed)
Pt aware.

## 2011-11-17 ENCOUNTER — Ambulatory Visit (HOSPITAL_COMMUNITY)
Admission: RE | Admit: 2011-11-17 | Discharge: 2011-11-17 | Disposition: A | Discharge status: Home / Self Care | Payer: 59 | Source: Ambulatory Visit | Attending: Family Medicine | Admitting: Family Medicine

## 2011-11-17 NOTE — Progress Notes (Signed)
Physical Therapy Treatment Patient Details  Name: Gabrielle Richards MRN: [id number] Date of Birth: January 15, 1971  Today's Date: 11/17/2011 Time: 1308-6578 Time Calculation (min): 49 min Visit#: 11  of 16   Re-eval: 12/09/11 Charges:  therex 38'  Subjective: Symptoms/Limitations Symptoms: Pt. states her pain is the same as last visit; 5/10 neck/4/10 LBP Pain Assessment Currently in Pain?: Yes Pain Score:   4 Pain Location: Back Pain Orientation: Right;Lower   Exercise/Treatments Stretches Passive Hamstring Stretch: 3 reps;30 seconds Single Knee to Chest Stretch: 5 reps;20 seconds Single Knee to Chest Stretch Limitations: bilateral with therapist's assist Lower Trunk Rotation: 5 reps;10 seconds Lumbar Exercises Scapular Retraction: Both;15 reps;Standing;Theraband Theraband Level (Scapular Retraction): Level 3 (Green) Row: Both;15 reps;Standing;Theraband Theraband Level (Row): Level 3 (Green) Shoulder Extension: Both;15 reps;Standing;Theraband Theraband Level (Shoulder Extension): Level 3 (Green) Stability Clam: Supine;10 reps;4 seconds Clam Limitations: with ab sets Bridge: 15 reps;Limitations Bridge Limitations: with ab sets Bent Knee Raise: 15 reps;Limitations Bent Knee Raise Limitations: with ab sets Straight Leg Raise: 10 reps Straight Leg Raises Limitations: bilateral Heel Squeeze: Prone;10 reps;5 seconds Single Arm Raise: Prone;10 reps Leg Raise: Prone;10 reps Opposite Arm/Leg Raise: Prone;10 reps Functional Squats: 10 reps;Limitations Functional Squats Limitations: no manual Assist needed Machine Exercises Tread Mill: 6' @ 2.0  UBE (Upper Arm Bike): 4' backwards @ 2.0   Physical Therapy Assessment and Plan PT Assessment and Plan Clinical Impression Statement: Pt. able to demonstrate squats correctly with good form today; able to complete all other exercises with improving stability. PT Plan: Continue to progress; begin lifting task using proper body mechanics  next visit.     Problem List Patient Active Problem List  Diagnoses  . Neck pain  . Lumbar back pain  . Obesity  . Muscle weakness (generalized)  . Limitation of joint motion of low back  . Limitation of joint motion of neck    PT - End of Session Activity Tolerance: Patient tolerated treatment well General Behavior During Session: [facility] for tasks performed Cognition: [facility] for tasks performed   Thorin Starner B. Bascom Levels, PTA 11/17/2011, 5:40 PM

## 2011-11-19 ENCOUNTER — Ambulatory Visit (HOSPITAL_COMMUNITY)
Admission: RE | Admit: 2011-11-19 | Discharge: 2011-11-19 | Disposition: A | Discharge status: Home / Self Care | Payer: 59 | Source: Ambulatory Visit | Attending: Family Medicine | Admitting: Family Medicine

## 2011-11-19 NOTE — Progress Notes (Signed)
Physical Therapy Treatment Patient Details  Name: Gabrielle Richards MRN: [id number] Date of Birth: May 15, 1971  Today's Date: 11/19/2011 Time: 4098-1191 Time Calculation (min): 42 min Visit#: 12  of 16   Re-eval: 12/09/11 Charges:  therex 32'    Subjective: Symptoms/Limitations Symptoms: Pt. reports she was sore after last visit but now her pain is same as last visit. Pain Assessment Currently in Pain?: Yes Pain Score:   4 Pain Location: Back Pain Orientation: Right;Lower   Exercise/Treatments Stretches Passive Hamstring Stretch: 3 reps;30 seconds Lumbar Exercises Scapular Retraction: Both;15 reps;Standing;Theraband Theraband Level (Scapular Retraction): Level 3 (Green) Row: Both;15 reps;Standing;Theraband Theraband Level (Row): Level 3 (Green) Shoulder Extension: Both;15 reps;Standing;Theraband Theraband Level (Shoulder Extension): Level 3 (Green) Stability Clam: Supine;15 reps Clam Limitations: with ab sets Bridge: 20 reps Bridge Limitations: with ab Archivist Knee Raise: 20 reps Bent Knee Raise Limitations: with ab sets Straight Leg Raise: 10 reps Straight Leg Raises Limitations: bilateral Heel Squeeze: Prone;15 reps;5 seconds Single Arm Raise: Prone;10 reps Leg Raise: Prone;10 reps Opposite Arm/Leg Raise: Prone;10 reps Lifting: From 12";5 reps;Weights Lifting Weights (lbs): 8# box Machine Exercises Tread Mill: 6' @ 2.0  UBE (Upper Arm Bike): 4' backwards @ 2.0     Physical Therapy Assessment and Plan PT Assessment and Plan Clinical Impression Statement: Began lifting education/body mechanics with manual/VC's for form and technique.  Able to increase reps today without difficulty.  Pt. needing less cues with therex. PT Plan: Continue X 2 more weeks.  Progress core stability.     Problem List Patient Active Problem List  Diagnoses  . Neck pain  . Lumbar back pain  . Obesity  . Muscle weakness (generalized)  . Limitation of joint motion of low back  .  Limitation of joint motion of neck    PT - End of Session Activity Tolerance: Patient tolerated treatment well General Behavior During Session: [facility] for tasks performed Cognition: [facility] for tasks performed    Amy B. Bascom Levels, PTA 11/19/2011, 5:17 PM

## 2011-11-23 ENCOUNTER — Ambulatory Visit (HOSPITAL_COMMUNITY)
Admission: RE | Admit: 2011-11-23 | Discharge: 2011-11-23 | Disposition: A | Discharge status: Home / Self Care | Payer: 59 | Source: Ambulatory Visit | Attending: Family Medicine | Admitting: Family Medicine

## 2011-11-23 DIAGNOSIS — M542 Cervicalgia: Secondary | ICD-10-CM | POA: Insufficient documentation

## 2011-11-23 DIAGNOSIS — M6281 Muscle weakness (generalized): Secondary | ICD-10-CM | POA: Insufficient documentation

## 2011-11-23 DIAGNOSIS — IMO0001 Reserved for inherently not codable concepts without codable children: Secondary | ICD-10-CM | POA: Insufficient documentation

## 2011-11-23 DIAGNOSIS — M545 Low back pain, unspecified: Secondary | ICD-10-CM | POA: Insufficient documentation

## 2011-11-23 NOTE — Progress Notes (Signed)
Physical Therapy Treatment Patient Details  Name: Gabrielle Richards MRN: [id number] Date of Birth: 09-30-1970  Today's Date: 11/23/2011 Time: 3295-1884 Time Calculation (min): 43 min Visit#: 13  of 16   Re-eval: 12/03/11 Charges:  therex 32'    Subjective: Symptoms/Limitations Symptoms: Pt. reports she was sore again after last visit but feeling better today.  Currently 4 in both areas (neck and back) Pain Assessment Currently in Pain?: Yes Pain Score:   4 Pain Location: Back Pain Orientation: Right;Lower   Exercise/Treatments Lumbar Exercises Scapular Retraction: 15 reps Theraband Level (Scapular Retraction): Level 4 (Blue) Row: Both;15 reps;Standing;Theraband Theraband Level (Row): Level 4 (Blue) Shoulder Extension: Both;15 reps;Standing;Theraband Theraband Level (Shoulder Extension): Level 4 (Blue) Stability Clam: Supine;15 reps Clam Limitations: with ab sets Bridge: 20 reps Bridge Limitations: with ab Archivist Knee Raise: 20 reps Bent Knee Raise Limitations: with ab sets Large Ball Abdominal Isometric: 10 reps Large Ball Oblique Isometric: 10 reps Straight Leg Raise: 15 reps Straight Leg Raises Limitations: bilateral Heel Squeeze: Prone;20 reps Single Arm Raise: Prone;20 reps Leg Raise: Prone;20 reps Opposite Arm/Leg Raise: Prone;20 reps Machine Exercises Tread Mill: 6' @ 2.0  UBE (Upper Arm Bike): 4' backwards @ 2.0     Physical Therapy Assessment and Plan PT Assessment and Plan Clinical Impression Statement: Began ball exercises for core strengthening; Able to increase reps today without difficulty. PT Plan: Progress bridging with ball or single leg next visit.  Resume squats/or wallslides and review lifting techniques.     Problem List Patient Active Problem List  Diagnoses  . Neck pain  . Lumbar back pain  . Obesity  . Muscle weakness (generalized)  . Limitation of joint motion of low back  . Limitation of joint motion of neck    PT - End of  Session Activity Tolerance: Patient tolerated treatment well General Behavior During Session: [facility] for tasks performed Cognition: [facility] for tasks performed    Lucinda Spells B. Bascom Levels, PTA 11/23/2011, 5:36 PM

## 2011-11-25 ENCOUNTER — Ambulatory Visit (HOSPITAL_COMMUNITY)
Admission: RE | Admit: 2011-11-25 | Discharge: 2011-11-25 | Disposition: A | Discharge status: Home / Self Care | Payer: 59 | Source: Ambulatory Visit | Attending: Family Medicine | Admitting: Family Medicine

## 2011-11-25 DIAGNOSIS — M5380 Other specified dorsopathies, site unspecified: Secondary | ICD-10-CM

## 2011-11-25 DIAGNOSIS — M436 Torticollis: Secondary | ICD-10-CM

## 2011-11-25 NOTE — Progress Notes (Signed)
Physical Therapy Treatment Patient Details  Name: Gabrielle Richards MRN: [id number] Date of Birth: [birth year]  Today's Date: 11/25/2011 Time: 2130-8657 Time Calculation (min): 45 min Visit#: 14  of 18   Re-eval: 12/03/11  Charge: therex 38 min  Subjective: Symptoms/Limitations Symptoms: Over did it with work today, currently 4/10 LBP. Pain Assessment Currently in Pain?: Yes Pain Score:   4 Pain Location: Back Pain Orientation: Lower;Right  Objective:   Exercise/Treatments Stability Clam: Side-lying;20 reps;Limitations Clam Limitations: with ab sets Bridge: Supine;15 reps;5 seconds;Limitations Bridge Limitations: bridge on green ball with ab set 15reps; single leg bridges 10 reps Bent Knee Raise: 20 reps Bent Knee Raise Limitations: with ab sets Large Ball Abdominal Isometric: 10 reps;5 seconds Large Ball Oblique Isometric: 10 reps;5 seconds Straight Leg Raise: 15 reps Straight Leg Raises Limitations: bilateral Heel Squeeze: Prone;20 reps;5 seconds Opposite Arm/Leg Raise: Prone;Right arm/Left leg;Left arm/Right leg;15 reps;5 seconds Wall Slides: 15 reps;Limitations Wall Slides Limitations: 10" holds with ab set Machine Exercises Tread Mill: 6' @ 2.0      Physical Therapy Assessment and Plan PT Assessment and Plan Clinical Impression Statement: Progressed bridges to single leg bridges and bridge on green ball for core strengthening, able to complete without c/o but noted core and hip extensor weakness. PT Plan: Continue with current POC, review lifting techniques.  Trial on elliptical for endurance next session.    Goals    Problem List Patient Active Problem List  Diagnoses  . Neck pain  . Lumbar back pain  . Obesity  . Muscle weakness (generalized)  . Limitation of joint motion of low back  . Limitation of joint motion of neck    PT - End of Session Activity Tolerance: Patient tolerated treatment well General Behavior During Session: [facility] for tasks  performed Cognition: [facility] for tasks performed  Juel Burrow, PTA 11/25/2011, 5:30 PM

## 2011-11-30 ENCOUNTER — Ambulatory Visit (HOSPITAL_COMMUNITY)
Admission: RE | Admit: 2011-11-30 | Discharge: 2011-11-30 | Disposition: A | Discharge status: Home / Self Care | Payer: 59 | Source: Ambulatory Visit | Attending: Family Medicine | Admitting: Family Medicine

## 2011-11-30 NOTE — Progress Notes (Signed)
Physical Therapy Treatment Patient Details  Name: Gabrielle Richards MRN: [id number] Date of Birth: October 06, 1970  Today's Date: 11/30/2011 Time: 4098-1191 Time Calculation (min): 39 min Visit#: 15  of 18   Re-eval: 12/03/11 Charges:  therex 30'    Subjective: Symptoms/Limitations Symptoms: Pt. states her pain remains 4/10 today. Pain Assessment Currently in Pain?: Yes Pain Score:   4 Pain Location: Back Pain Orientation: Right;Lower   Exercise/Treatments Stability Clam: Side-lying;20 reps;Limitations Clam Limitations: with ab sets Bridge: Supine;15 reps;5 seconds;Limitations Bridge Limitations: bridge on green ball with ab set 15reps; single leg bridges 10 reps Bent Knee Raise: 20 reps Bent Knee Raise Limitations: with ab sets Large Ball Abdominal Isometric: 15 reps;5 seconds Large Ball Oblique Isometric: 15 reps;5 seconds Straight Leg Raise: 20 reps Straight Leg Raises Limitations: bilateral Heel Squeeze: Prone;20 reps;5 seconds Opposite Arm/Leg Raise: Prone;Right arm/Left leg;Left arm/Right leg;15 reps;5 seconds Wall Slides: 15 reps;Limitations Wall Slides Limitations: 10" holds with ab Customer service manager Exercises Elliptical: 3' Tread Mill: 6' @ 2.0     Physical Therapy Assessment and Plan PT Assessment and Plan Clinical Impression Statement: Improved control with bridge using ball. Able to progress most reps.  Added elliptical with noted fatigue at 3 minutes. PT Plan: Continue to progress strength and endurance;  Add forward lunges;  Has 3 visits remaining.     Problem List Patient Active Problem List  Diagnoses  . Neck pain  . Lumbar back pain  . Obesity  . Muscle weakness (generalized)  . Limitation of joint motion of low back  . Limitation of joint motion of neck    PT - End of Session Activity Tolerance: Patient tolerated treatment well General Behavior During Session: [facility] for tasks performed Cognition: [facility] for tasks performed    Amy B. Bascom Levels,  PTA 11/30/2011, 5:27 PM

## 2011-12-02 ENCOUNTER — Ambulatory Visit (HOSPITAL_COMMUNITY)
Admission: RE | Admit: 2011-12-02 | Discharge: 2011-12-02 | Disposition: A | Discharge status: Home / Self Care | Payer: 59 | Source: Ambulatory Visit

## 2011-12-02 DIAGNOSIS — M436 Torticollis: Secondary | ICD-10-CM

## 2011-12-02 DIAGNOSIS — M5380 Other specified dorsopathies, site unspecified: Secondary | ICD-10-CM

## 2011-12-02 NOTE — Progress Notes (Signed)
Physical Therapy Treatment Patient Details  Name: Gabrielle Richards MRN: [id number] Date of Birth: 03-Jan-1971  Today's Date: 12/02/2011 Time: 1650-1730 Time Calculation (min): 40 min Visit#: 16  of 18   Re-eval: 12/03/11  Charge: therex 40 min   Subjective: Symptoms/Limitations Symptoms: Pain scale 5/10 neck and back right side. Pain Assessment Currently in Pain?: Yes Pain Score:   5 Pain Location: Back Pain Orientation: Right;Lower  Objective:   Exercise/Treatments  Stability Clam: Side-lying;20 reps;Limitations Clam Limitations: with ab sets Bridge: Supine;20 reps;Limitations Bridge Limitations: bridge on green ball with ab set 15reps; h/s curl with bridge 10 reps Large Ball Abdominal Isometric: 15 reps;5 seconds Large Ball Oblique Isometric: 15 reps;5 seconds Straight Leg Raise: 20 reps Straight Leg Raises Limitations: bilateral Heel Squeeze: Prone;20 reps;5 seconds Opposite Arm/Leg Raise: Prone;Right arm/Left leg;Left arm/Right leg;15 reps;5 seconds Forward Lunge: 10 reps;5 seconds;Limitations Forward Lunge Limitations: both LE Wall Slides: 15 reps;Limitations Wall Slides Limitations: 10" holds with ab Customer service manager Exercises Elliptical: 5' @ L1     Physical Therapy Assessment and Plan PT Assessment and Plan Clinical Impression Statement: Able to increase endurance time on elliptical with noted fatigue following.  Added forward lunges with min cueing for proper form without difficutly.  Increased difficutly with bridges on ball by adding h/s curl for extensor strengthening, pt able to complete with some difficulty due to the weakness. PT Plan: Continue progressing strength and endurance.  Include cervical motions and strengthening next session.  Reassess in 2 visits.      Goals    Problem List Patient Active Problem List  Diagnoses  . Neck pain  . Lumbar back pain  . Obesity  . Muscle weakness (generalized)  . Limitation of joint motion of low back  .  Limitation of joint motion of neck    PT - End of Session Activity Tolerance: Patient tolerated treatment well General Behavior During Session: [facility] for tasks performed Cognition: [facility] for tasks performed  Juel Burrow, PTA 12/02/2011, 5:44 PM

## 2011-12-07 ENCOUNTER — Ambulatory Visit (HOSPITAL_COMMUNITY)
Admission: RE | Admit: 2011-12-07 | Discharge: 2011-12-07 | Disposition: A | Discharge status: Home / Self Care | Payer: 59 | Source: Ambulatory Visit | Attending: Family Medicine | Admitting: Family Medicine

## 2011-12-07 DIAGNOSIS — M436 Torticollis: Secondary | ICD-10-CM

## 2011-12-07 DIAGNOSIS — M5380 Other specified dorsopathies, site unspecified: Secondary | ICD-10-CM

## 2011-12-07 NOTE — Progress Notes (Signed)
Physical Therapy Treatment Patient Details  Name: Gabrielle Richards MRN: [id number] Date of Birth: 12/07/1970  Today's Date: 12/07/2011 Time: 4098-1191 Time Calculation (min): 58 min Visit#: 17  of 18   Re-eval: 12/03/11  Charge: NMR 10 min Therex 43 min  Subjective: Symptoms/Limitations Symptoms: Feeling good today, no pain.  Pt requested we spend more time with cervical exercises for ROM and strengthening. Pain Assessment Currently in Pain?: No/denies  Precautions/Restrictions     Exercise/Treatments Mobility/Balance        Stretches Upper Trapezius Stretch: 3 reps;30 seconds Upper Trapezius Stretch Limitations: bil Neck Exercises Neck Lateral Flexion - Right: 5 reps;Seated;Limitations Neck Lateral Flexion - Right Limitations: NMR to decrease UT contractions to utilize correct musculature contractions Neck Lateral Flexion - Left: 5 reps;Seated;Limitations Neck Lateral Flexion - Left Limitations: NMR to decrease UT contractions to utilize correct musculature contractions Neck Rotation - Right: 5 reps;Seated;Limitations Neck Rotation - Left: 5 reps;Seated;Limitations Stability Clam: Side-lying;20 reps;Limitations Clam Limitations: with ab sets Bridge: Supine;20 reps;Limitations Bridge Limitations: bridge on green ball with ab set 15reps; h/s curl with bridge 10 reps Ab Set: Limitations AB Set Limitations: sidelying TA contraction with manual and verbal cueingmax of 4" L SL; 7" max R SL Large Ball Abdominal Isometric: 15 reps;5 seconds Large Ball Oblique Isometric: 15 reps;5 seconds Wall Slides: 15 reps;Limitations Wall Slides Limitations: 10" holds with ab set with alternating UE movements Machine Exercises Elliptical: 5' @ L3     Physical Therapy Assessment and Plan PT Assessment and Plan Clinical Impression Statement: Pt with tight R UT noted with cervical movements, pt able to complete UT stretch with correct form.  NMR with cervical movements for correct  musculature contractions, pt with increased ROM sidebending and rotation once UT not activated and no c/o pain.  Added sidelying TA contractions for spinal stabilization, pt weak with 4" max contractions on L side and 7" max on R.  Pt stated she could feel the relief and stability on lower back when contracting TA. PT Plan: Reassess both cervical and lumbar next session.    Goals    Problem List Patient Active Problem List  Diagnoses  . Neck pain  . Lumbar back pain  . Obesity  . Muscle weakness (generalized)  . Limitation of joint motion of low back  . Limitation of joint motion of neck    PT - End of Session Activity Tolerance: Patient tolerated treatment well General Behavior During Session: [facility] for tasks performed Cognition: [facility] for tasks performed  Juel Burrow, PTA 12/07/2011, 5:55 PM

## 2011-12-09 ENCOUNTER — Ambulatory Visit (HOSPITAL_COMMUNITY)
Admission: RE | Admit: 2011-12-09 | Discharge: 2011-12-09 | Disposition: A | Discharge status: Home / Self Care | Payer: 59 | Source: Ambulatory Visit | Attending: Family Medicine | Admitting: Family Medicine

## 2011-12-09 DIAGNOSIS — M5380 Other specified dorsopathies, site unspecified: Secondary | ICD-10-CM

## 2011-12-09 DIAGNOSIS — M436 Torticollis: Secondary | ICD-10-CM

## 2011-12-09 NOTE — Progress Notes (Signed)
Physical Therapy Re-evaluation/treatment  Patient Details  Name: KATHY WAHID MRN: [id number] Date of Birth: Mar 18, 1971  Today's Date: 12/09/2011 Time: 4098-1191 Time Calculation (min): 42 min  Visit#: 18  of 18   Re-eval:   Assessment Diagnosis: low back and neck pain Charge: MMT 1 unit ROM Measurement 1 unit therex 23 min  Subjective Symptoms/Limitations Symptoms: No pain just a little sore today.   Pain Assessment Currently in Pain?: No/denies  Objective:  Assessment RUE Strength Right Shoulder Flexion: 5/5 Right Shoulder ABduction:  (4+/5) RLE Strength Right Hip Flexion: 5/5 Right Hip Extension: 5/5 Right Knee Flexion: 5/5 Right Knee Extension:  (4+/5) Right Ankle Dorsiflexion: 5/5 LLE Strength Left Hip Flexion: 5/5 Left Hip Extension: 5/5 Cervical AROM Cervical Flexion: WFL Cervical Extension: decreased 10% with pain on R cervical Cervical - Right Side Bend: WFL Cervical - Left Side Bend: WFL with pain on R cervical Cervical - Right Rotation: WFL Cervical - Left Rotation: Decreased 5 % Cervical Strength Cervical Flexion: 5/5 Cervical Extension: 5/5 Cervical - Right Side Bend: 5/5 Cervical - Left Side Bend:  (4+/5) Cervical - Right Rotation: 5/5 Cervical - Left Rotation:  (4+/5) Lumbar AROM Lumbar Flexion: WFL Lumbar Extension: WFL Lumbar - Right Side Bend: Decreased 10% Lumbar - Left Side Bend: WFL Lumbar - Right Rotation: WFL Lumbar - Left Rotation: decreased 10% Lumbar Strength Lumbar Flexion: 4/5 Lumbar Extension: 4/5  Exercise/Treatments Stability Clam: Side-lying;15 reps;Limitations Clam Limitations: NMR for glut med only no compensation Ab Set: Limitations AB Set Limitations: sidelying TA contraction with manual and verbal cueing max of 4" L SL; 2 reps 10 holds R SL  Physical Therapy Assessment and Plan PT Assessment and Plan Clinical Impression Statement: Re-eval complete, Mrs. Benett has had 18 OPPT sessions over 8 weeks with 3/3  STGs and 3/6 LTGs met.  Pt independent with HEP and able to demonstrate proper form/technique with all exercises.  Pt pain free for this session, pt stated average pain scale range from 0-4/10.  Pt has improved her cervical, core and LE strength to all [facility] as well as the cervical and lumbar ROM.  Pt able to correctly lift 10# box with proper lifting mechanics noted with no cueing required.   PT Plan: D/C to HEP.    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 PT Short Term Goal 2 - Progress: Met PT Short Term Goal 3: The patient will increase her hip extension and hip flexion strength to at least 4/5 to show improved leg strength and tolerance of work tasks PT Short Term Goal 3 - Progress: Met PT Long Term Goals PT Long Term Goal 1: The pateint will report a maximal weekly pain of less than or equal to 3/10 in her low back and neck to show improved QOL and tolerance of functional activities PT Long Term Goal 1 - Progress: Progressing toward goal PT Long Term Goal 2: The patient will increase core strength to greater than or equal to 4/5 to show increased spinal stability and strength PT Long  Term Goal 2 - Progress: Met Long Term Goal 3: The patient will increase her neck strength to at least 4/5 to show improved cervical stability and strength. Long Term Goal 3 Progress: Met Long Term Goal 4: The patient will increase cervical ROM to within 5% of normal limits to show improved mobility Long Term Goal 4 Progress: Progressing toward  goal PT Long Term Goal 5: The patient will increase lumbar ROM to within 5% of normal to show imroved mobility Long Term Goal 5 Progress: Partly met Additional PT Long Term Goals?: Yes PT Long Term Goal 6: The pateint will be able to demonstrate correct lifting of an empty box 10 times to show improved tolerance of lifting equipment and supplies at work as well as retaining teaching of correct lifting techniques Long Term Goal 6 Progress: Met  Problem List Patient Active Problem List  Diagnoses  . Neck pain  . Lumbar back pain  . Obesity  . Muscle weakness (generalized)  . Limitation of joint motion of low back  . Limitation of joint motion of neck    PT - End of Session Activity Tolerance: Patient tolerated treatment well General Behavior During Session: [facility] for tasks performed Cognition: [facility] for tasks performed  Juel Burrow, PTA 12/09/2011, 6:41 PM

## 2011-12-11 ENCOUNTER — Encounter: Payer: Self-pay | Admitting: Family Medicine

## 2011-12-11 ENCOUNTER — Ambulatory Visit (INDEPENDENT_AMBULATORY_CARE_PROVIDER_SITE_OTHER): Payer: 59 | Admitting: Family Medicine

## 2011-12-11 VITALS — BP 120/84 | HR 81 | Resp 16 | Ht 62.0 in | Wt 241.8 lb

## 2011-12-11 DIAGNOSIS — E669 Obesity, unspecified: Secondary | ICD-10-CM

## 2011-12-11 DIAGNOSIS — M545 Low back pain, unspecified: Secondary | ICD-10-CM

## 2011-12-11 DIAGNOSIS — M542 Cervicalgia: Secondary | ICD-10-CM

## 2011-12-11 DIAGNOSIS — E785 Hyperlipidemia, unspecified: Secondary | ICD-10-CM

## 2011-12-11 NOTE — Patient Instructions (Addendum)
Work on the the weight loss I recommend 30 minutes of exercise at least 5 days a week Increase water, fresh fruits, veggies, avoid fried foods and fast food  F/U 6 months for weight and cholesterol

## 2011-12-13 ENCOUNTER — Encounter: Payer: Self-pay | Admitting: Family Medicine

## 2011-12-13 DIAGNOSIS — E785 Hyperlipidemia, unspecified: Secondary | ICD-10-CM | POA: Insufficient documentation

## 2011-12-13 NOTE — Assessment & Plan Note (Signed)
Currently without pain, but she does have disc bulge noted on MRI at this time no restrictions with activity

## 2011-12-13 NOTE — Assessment & Plan Note (Signed)
Pt motivated to restart intense weight loss plan, discussed foods to incorporate and those to avoid

## 2011-12-13 NOTE — Assessment & Plan Note (Addendum)
Pt given 6 months to work on diet she has no other co-morbidities, will recheck lipids at that time, if still elevated start statin therapy

## 2011-12-13 NOTE — Assessment & Plan Note (Signed)
Resolved, completed PT
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
## 2012-06-22 ENCOUNTER — Other Ambulatory Visit (HOSPITAL_COMMUNITY)
Admission: RE | Admit: 2012-06-22 | Discharge: 2012-06-22 | Disposition: A | Discharge status: Home / Self Care | Payer: 59 | Source: Ambulatory Visit | Attending: Obstetrics and Gynecology | Admitting: Obstetrics and Gynecology

## 2012-06-22 DIAGNOSIS — Z1151 Encounter for screening for human papillomavirus (HPV): Secondary | ICD-10-CM | POA: Insufficient documentation

## 2012-06-22 DIAGNOSIS — Z01419 Encounter for gynecological examination (general) (routine) without abnormal findings: Secondary | ICD-10-CM | POA: Insufficient documentation

## 2012-09-22 ENCOUNTER — Other Ambulatory Visit: Payer: Self-pay | Admitting: Family Medicine

## 2012-09-22 DIAGNOSIS — Z139 Encounter for screening, unspecified: Secondary | ICD-10-CM

## 2012-09-26 ENCOUNTER — Ambulatory Visit (HOSPITAL_COMMUNITY)
Admission: RE | Admit: 2012-09-26 | Discharge: 2012-09-26 | Disposition: A | Discharge status: Home / Self Care | Payer: 59 | Source: Ambulatory Visit | Attending: Family Medicine | Admitting: Family Medicine

## 2012-09-26 DIAGNOSIS — Z139 Encounter for screening, unspecified: Secondary | ICD-10-CM

## 2012-09-26 DIAGNOSIS — Z1231 Encounter for screening mammogram for malignant neoplasm of breast: Secondary | ICD-10-CM | POA: Insufficient documentation

## 2012-11-03 ENCOUNTER — Encounter (HOSPITAL_COMMUNITY): Payer: Self-pay | Admitting: Pharmacy Technician

## 2012-11-16 ENCOUNTER — Encounter (HOSPITAL_COMMUNITY): Payer: Self-pay

## 2012-11-16 ENCOUNTER — Encounter (HOSPITAL_COMMUNITY)
Admission: RE | Admit: 2012-11-16 | Discharge: 2012-11-16 | Disposition: A | Discharge status: Home / Self Care | Payer: 59 | Source: Ambulatory Visit | Attending: Obstetrics & Gynecology | Admitting: Obstetrics & Gynecology

## 2012-11-16 ENCOUNTER — Other Ambulatory Visit: Payer: Self-pay | Admitting: Obstetrics & Gynecology

## 2012-11-16 LAB — COMPREHENSIVE METABOLIC PANEL
Albumin: 3.7 g/dL (ref 3.5–5.2)
Alkaline Phosphatase: 64 U/L (ref 39–117)
BUN: 11 mg/dL (ref 6–23)
Creatinine, Ser: 0.92 mg/dL (ref 0.50–1.10)
GFR calc Af Amer: 87 mL/min — ABNORMAL LOW (ref >90)
Glucose, Bld: 86 mg/dL (ref 70–99)
Total Bilirubin: 0.3 mg/dL (ref 0.3–1.2)
Total Protein: 7.1 g/dL (ref 6.0–8.3)

## 2012-11-16 LAB — URINE MICROSCOPIC-ADD ON

## 2012-11-16 LAB — CBC
HCT: 40.1 % (ref 36.0–46.0)
Hemoglobin: 13.5 g/dL (ref 12.0–15.0)
MCHC: 33.7 g/dL (ref 30.0–36.0)
MCV: 87.7 fL (ref 78.0–100.0)
RDW: 13.6 % (ref 11.5–15.5)

## 2012-11-16 LAB — HCG, QUANTITATIVE, PREGNANCY: hCG, Beta Chain, Quant, S: <1 m[IU]/mL (ref <5)

## 2012-11-16 LAB — URINALYSIS, ROUTINE W REFLEX MICROSCOPIC
Glucose, UA: NEGATIVE mg/dL
Ketones, ur: NEGATIVE mg/dL
Nitrite: NEGATIVE
Protein, ur: NEGATIVE mg/dL
pH: 6 (ref 5.0–8.0)

## 2012-11-16 MED ORDER — KETOROLAC TROMETHAMINE 30 MG/ML IJ SOLN: 30.0000 mg | Freq: Once | INTRAMUSCULAR | Status: DC

## 2012-11-16 NOTE — Patient Instructions (Signed)
Gabrielle Richards  11/16/2012   Your procedure is scheduled on:  11/21/12  Report to [facility] at 06:15 AM.  Call this number if you have problems the morning of surgery: [phone]   Remember:   Do not eat food or drink liquids after midnight.   Take these medicines the morning of surgery with A SIP OF WATER: None   Do not wear jewelry, make-up or nail polish.  Do not wear lotions, powders, or perfumes.   Do not shave 48 hours prior to surgery. Men may shave face and neck.  Do not bring valuables to the hospital.  Contacts, dentures or bridgework may not be worn into surgery.  Leave suitcase in the car. After surgery it may be brought to your room.  For patients admitted to the hospital, checkout time is 11:00 AM the day of discharge.   Patients discharged the day of surgery will not be allowed to drive  home.   Special Instructions: Shower using CHG 2 nights before surgery and the night before surgery.  If you shower the day of surgery use CHG.  Use special wash - you have one bottle of CHG for all showers.  You should use approximately 1/3 of the bottle for each shower.   Please read over the following fact sheets that you were given: Pain Booklet, MRSA Information, Surgical Site Infection Prevention, Anesthesia Post-op Instructions and Care and Recovery After Surgery    Dilation and Curettage or Vacuum Curettage Dilation and curettage (D&C) and vacuum curettage are minor procedures. A D&C involves stretching (dilation) the cervix and scraping (curettage) the inside lining of the womb (uterus). During a D&C, tissue is gently scraped from the inside lining of the uterus. During a vacuum curettage, the lining and tissue in the uterus are removed with the use of gentle suction. Curettage may be performed for diagnostic or therapeutic purposes. As a diagnostic procedure, curettage is performed for the purpose of examining tissues from the uterus. Tissue examination may help determine causes  or treatment options for symptoms. A diagnostic curettage may be performed for the following symptoms:  Irregular bleeding in the uterus.  Bleeding with the development of clots.  Spotting between menstrual periods.  Prolonged menstrual periods.  Bleeding after menopause.  No menstrual period (amenorrhea).  A change in size and shape of the uterus. A therapeutic curettage is performed to remove tissue, blood, or a contraceptive device. Therapeutic curettage may be performed for the following conditions:   Removal of an IUD (intrauterine device).  Removal of retained placenta after giving birth. Retained placenta can cause bleeding severe enough to require transfusions or an infection.  Abortion.  Miscarriage.  Removal of polyps inside the uterus.  Removal of uncommon types of fibroids (noncancerous lumps). LET YOUR CAREGIVER KNOW ABOUT:   Allergies to food or medicine.  Medicines taken, including vitamins, herbs, eyedrops, over-the-counter medicines, and creams.  Use of steroids (by mouth or creams).  Previous problems with anesthetics or numbing medicines.  History of bleeding problems or blood clots.  Previous surgery.  Other health problems, including diabetes and kidney problems.  Possibility of pregnancy, if this applies. RISKS AND COMPLICATIONS   Excessive bleeding.  Infection of the uterus.  Damage to the cervix.  Development of scar tissue (adhesions) inside the uterus, later causing abnormal amounts of menstrual bleeding.  Complications from the general anesthetic, if a general anesthetic is used.  Putting a hole (perforation) in the uterus. This is rare. BEFORE THE PROCEDURE  Eat and drink before the procedure only as directed by your caregiver.  Arrange for someone to take you home. PROCEDURE   This procedure may be done in a hospital, outpatient clinic, or caregiver's office.  You may be given a general anesthetic or a local anesthetic  in and around the cervix.  You will lie on your back with your legs in stirrups.  There are two ways in which your cervix can be softened and dilated. These include:  Taking a medicine.  Having thin rods (laminaria) inserted into your cervix.  A curved tool (curette) will scrape cells from the inside lining of the uterus and will then be removed. This procedure usually takes about 15 to 30 minutes. AFTER THE PROCEDURE   You will rest in the recovery area until you are stable and are ready to go home.  You will need to have someone take you home.  You may feel sick to your stomach (nauseous) or throw up (vomit) if you had general anesthesia.  You may have a sore throat if a tube was placed in your throat during general anesthesia.  You may have light cramping and bleeding for 2 days to 2 weeks after the procedure.  Your uterus needs to make a new lining after the procedure. This may make your next period late. Document Released: 09/07/2005 Document Revised: 11/30/2011 Document Reviewed: 04/05/2009 [facility] Patient Information 2013 [place], [place].   Endometrial Ablation Endometrial ablation removes the lining of the uterus (endometrium). It is usually a same day, outpatient treatment. Ablation helps avoid major surgery (such as a hysterectomy). A hysterectomy is removal of the cervix and uterus. Endometrial ablation has less risk and complications, has a shorter recovery period and is less expensive. After endometrial ablation, most women will have little or no menstrual bleeding. You may not keep your fertility. Pregnancy is no longer likely after this procedure but if you are pre-menopausal, you still need to use a reliable method of birth control following the procedure because pregnancy can occur. REASONS TO HAVE THE PROCEDURE MAY INCLUDE:  Heavy periods.  Bleeding that is causing anemia.  Anovulatory bleeding, very irregular, bleeding.  Bleeding submucous fibroids (on the  lining inside the uterus) if they are smaller than 3 centimeters. REASONS NOT TO HAVE THE PROCEDURE MAY INCLUDE:  You wish to have more children.  You have a pre-cancerous or cancerous problem. The cause of any abnormal bleeding must be diagnosed before having the procedure.  You have pain coming from the uterus.  You have a submucus fibroid larger than 3 centimeters.  You recently had a baby.  You recently had an infection in the uterus.  You have a severe retro-flexed, tipped uterus and cannot insert the instrument to do the ablation.  You had a Cesarean section or deep major surgery on the uterus.  The inner cavity of the uterus is too large for the endometrial ablation instrument. RISKS AND COMPLICATIONS   Perforation of the uterus.  Bleeding.  Infection of the uterus, bladder or vagina.  Injury to surrounding organs.  Cutting the cervix.  An air bubble to the lung (air embolus).  Pregnancy following the procedure.  Failure of the procedure to help the problem requiring hysterectomy.  Decreased ability to diagnose cancer in the lining of the uterus. BEFORE THE PROCEDURE  The lining of the uterus must be tested to make sure there is no pre-cancerous or cancer cells present.  Medications may be given to make the lining of the  uterus thinner.  Ultrasound may be used to evaluate the size and look for abnormalities of the uterus.  Future pregnancy is not desired. PROCEDURE  There are different ways to destroy the lining of the uterus.   Resectoscope - radio frequency-alternating electric current is the most common one used.  Cryotherapy - freezing the lining of the uterus.  Heated Free Liquid - heated salt (saline) solution inserted into the uterus.  Microwave - uses high energy microwaves in the uterus.  Thermal Balloon - a catheter with a balloon tip is inserted into the uterus and filled with heated fluid. Your caregiver will talk with you about the  method used in this clinic. They will also instruct you on the pros and cons of the procedure. Endometrial ablation is performed along with a procedure called operative hysteroscopy. A narrow viewing tube is inserted through the birth canal (vagina) and through the cervix into the uterus. A tiny camera attached to the viewing tube (hysteroscope) allows the uterine cavity to be shown on a TV monitor during surgery. Your uterus is filled with a harmless liquid to make the procedure easier. The lining of the uterus is then removed. The lining can also be removed with a resectoscope which allows your surgeon to cut away the lining of the uterus under direct vision. Usually, you will be able to go home within an hour after the procedure. HOME CARE INSTRUCTIONS   Do not drive for 24 hours.  No tampons, douching or intercourse for 2 weeks or until your caregiver approves.  Rest at home for 24 to 48 hours. You may then resume normal activities unless told differently by your caregiver.  Take your temperature two times a day for 4 days, and record it.  Take any medications your caregiver has ordered, as directed.  Use some form of contraception if you are pre-menopausal and do not want to get pregnant. Bleeding after the procedure is normal. It varies from light spotting and mildly watery to bloody discharge for 4 to 6 weeks. You may also have mild cramping. Only take over-the-counter or prescription medicines for pain, discomfort, or fever as directed by your caregiver. Do not use aspirin, as this may aggravate bleeding. Frequent urination during the first 24 hours is normal. You will not know how effective your surgery is until at least 3 months after the surgery. SEEK IMMEDIATE MEDICAL CARE IF:   Bleeding is heavier than a normal menstrual cycle.  An oral temperature above 102 F (38.9 C) develops.  You have increasing cramps or pains not relieved with medication or develop belly (abdominal) pain  which does not seem to be related to the same area of earlier cramping and pain.  You are light headed, weak or have fainting episodes.  You develop pain in the shoulder strap areas.  You have chest or leg pain.  You have abnormal vaginal discharge.  You have painful urination. Document Released: 07/17/2004 Document Revised: 11/30/2011 Document Reviewed: 10/15/2007 [facility] Patient Information 2013 [place], [place].     PATIENT INSTRUCTIONS POST-ANESTHESIA  IMMEDIATELY FOLLOWING SURGERY:  Do not drive or operate machinery for the first twenty four hours after surgery.  Do not make any important decisions for twenty four hours after surgery or while taking narcotic pain medications or sedatives.  If you develop intractable nausea and vomiting or a severe headache please notify your doctor immediately.  FOLLOW-UP:  Please make an appointment with your surgeon as instructed. You do not need to follow up with  anesthesia unless specifically instructed to do so.  WOUND CARE INSTRUCTIONS (if applicable):  Keep a dry clean dressing on the anesthesia/puncture wound site if there is drainage.  Once the wound has quit draining you may leave it open to air.  Generally you should leave the bandage intact for twenty four hours unless there is drainage.  If the epidural site drains for more than 36-48 hours please call the anesthesia department.  QUESTIONS?:  Please feel free to call your physician or the hospital operator if you have any questions, and they will be happy to assist you.

## 2012-11-17 LAB — URINE CULTURE: Colony Count: 5000

## 2012-11-18 ENCOUNTER — Inpatient Hospital Stay (HOSPITAL_COMMUNITY): Admission: RE | Admit: 2012-11-18 | Payer: 59 | Source: Ambulatory Visit

## 2012-11-21 ENCOUNTER — Encounter (HOSPITAL_COMMUNITY): Payer: Self-pay | Admitting: *Deleted

## 2012-11-21 ENCOUNTER — Encounter (HOSPITAL_COMMUNITY)
Admission: RE | Disposition: A | Discharge status: Home / Self Care | Payer: Self-pay | Source: Ambulatory Visit | Attending: Obstetrics & Gynecology

## 2012-11-21 ENCOUNTER — Ambulatory Visit (HOSPITAL_COMMUNITY)
Admission: RE | Admit: 2012-11-21 | Discharge: 2012-11-21 | Disposition: A | Discharge status: Home / Self Care | Payer: 59 | Source: Ambulatory Visit | Attending: Obstetrics & Gynecology | Admitting: Obstetrics & Gynecology

## 2012-11-21 ENCOUNTER — Ambulatory Visit (HOSPITAL_COMMUNITY): Payer: 59 | Admitting: Anesthesiology

## 2012-11-21 ENCOUNTER — Encounter (HOSPITAL_COMMUNITY): Payer: Self-pay | Admitting: Anesthesiology

## 2012-11-21 DIAGNOSIS — N920 Excessive and frequent menstruation with regular cycle: Secondary | ICD-10-CM | POA: Insufficient documentation

## 2012-11-21 DIAGNOSIS — Z01812 Encounter for preprocedural laboratory examination: Secondary | ICD-10-CM | POA: Insufficient documentation

## 2012-11-21 DIAGNOSIS — N946 Dysmenorrhea, unspecified: Secondary | ICD-10-CM | POA: Insufficient documentation

## 2012-11-21 HISTORY — PX: DILITATION & CURRETTAGE/HYSTROSCOPY WITH THERMACHOICE ABLATION: SHX5569

## 2012-11-21 SURGERY — DILATATION & CURETTAGE/HYSTEROSCOPY WITH THERMACHOICE ABLATION
Anesthesia: General | Site: Uterus | Wound class: Clean Contaminated

## 2012-11-21 MED ORDER — MIDAZOLAM HCL 2 MG/2ML IJ SOLN
INTRAMUSCULAR | Status: AC
  Filled 2012-11-21: qty 2

## 2012-11-21 MED ORDER — FENTANYL CITRATE 0.05 MG/ML IJ SOLN
25.0000 ug | INTRAMUSCULAR | Status: DC | PRN
  Administered 2012-11-21 (×2): 25 ug via INTRAVENOUS

## 2012-11-21 MED ORDER — ONDANSETRON HCL 8 MG PO TABS: 8.0000 mg | ORAL_TABLET | Freq: Three times a day (TID) | ORAL | Status: DC | PRN

## 2012-11-21 MED ORDER — MIDAZOLAM HCL 2 MG/2ML IJ SOLN
1.0000 mg | INTRAMUSCULAR | Status: DC | PRN
  Administered 2012-11-21: 2 mg via INTRAVENOUS

## 2012-11-21 MED ORDER — KETOROLAC TROMETHAMINE 30 MG/ML IJ SOLN
INTRAMUSCULAR | Status: AC
  Filled 2012-11-21: qty 1

## 2012-11-21 MED ORDER — DEXTROSE 5 % IV SOLN
INTRAVENOUS | Status: DC | PRN
  Administered 2012-11-21: 16 mL

## 2012-11-21 MED ORDER — ONDANSETRON HCL 4 MG/2ML IJ SOLN
4.0000 mg | Freq: Once | INTRAMUSCULAR | Status: AC | PRN
  Administered 2012-11-21: 4 mg via INTRAVENOUS

## 2012-11-21 MED ORDER — KETOROLAC TROMETHAMINE 30 MG/ML IJ SOLN
INTRAMUSCULAR | Status: DC | PRN
  Administered 2012-11-21: 30 mg via INTRAVENOUS

## 2012-11-21 MED ORDER — ONDANSETRON HCL 4 MG/2ML IJ SOLN
INTRAMUSCULAR | Status: AC
  Filled 2012-11-21: qty 2

## 2012-11-21 MED ORDER — FENTANYL CITRATE 0.05 MG/ML IJ SOLN
INTRAMUSCULAR | Status: DC | PRN
  Administered 2012-11-21: 100 ug via INTRAVENOUS

## 2012-11-21 MED ORDER — HYDROCODONE-IBUPROFEN 5-200 MG PO TABS: 2.0000 | ORAL_TABLET | ORAL | Status: DC | PRN

## 2012-11-21 MED ORDER — ONDANSETRON HCL 4 MG/2ML IJ SOLN
4.0000 mg | Freq: Once | INTRAMUSCULAR | Status: AC
Start: 2012-11-21 — End: 2012-11-21
  Administered 2012-11-21: 4 mg via INTRAVENOUS

## 2012-11-21 MED ORDER — CEFAZOLIN SODIUM-DEXTROSE 2-3 GM-% IV SOLR
INTRAVENOUS | Status: AC
  Filled 2012-11-21: qty 50

## 2012-11-21 MED ORDER — PROPOFOL 10 MG/ML IV BOLUS
INTRAVENOUS | Status: DC | PRN
  Administered 2012-11-21: 30 mg via INTRAVENOUS
  Administered 2012-11-21: 150 mg via INTRAVENOUS

## 2012-11-21 MED ORDER — LIDOCAINE HCL (CARDIAC) 10 MG/ML IV SOLN
INTRAVENOUS | Status: DC | PRN
  Administered 2012-11-21: 20 mg via INTRAVENOUS

## 2012-11-21 MED ORDER — SODIUM CHLORIDE 0.9 % IR SOLN
Status: DC | PRN
  Administered 2012-11-21: 3000 mL

## 2012-11-21 MED ORDER — FENTANYL CITRATE 0.05 MG/ML IJ SOLN
INTRAMUSCULAR | Status: AC
  Filled 2012-11-21: qty 2

## 2012-11-21 MED ORDER — LACTATED RINGERS IV SOLN
INTRAVENOUS | Status: DC
  Administered 2012-11-21: 07:00:00 via INTRAVENOUS

## 2012-11-21 MED ORDER — PROPOFOL 10 MG/ML IV EMUL
INTRAVENOUS | Status: AC
  Filled 2012-11-21: qty 20

## 2012-11-21 MED ORDER — 0.9 % SODIUM CHLORIDE (POUR BTL) OPTIME
TOPICAL | Status: DC | PRN
  Administered 2012-11-21: 1000 mL

## 2012-11-21 MED ORDER — CEFAZOLIN SODIUM-DEXTROSE 2-3 GM-% IV SOLR
2.0000 g | INTRAVENOUS | Status: AC
  Administered 2012-11-21: 2 g via INTRAVENOUS

## 2012-11-21 MED ORDER — LIDOCAINE HCL (PF) 1 % IJ SOLN
INTRAMUSCULAR | Status: AC
  Filled 2012-11-21: qty 5

## 2012-11-21 SURGICAL SUPPLY — 30 items
BAG DECANTER FOR FLEXI CONT (MISCELLANEOUS) ×2 IMPLANT
BAG HAMPER (MISCELLANEOUS) ×2 IMPLANT
CATH THERMACHOICE III (CATHETERS) ×2 IMPLANT
CLOTH BEACON ORANGE TIMEOUT ST (SAFETY) ×2 IMPLANT
COVER LIGHT HANDLE STERIS (MISCELLANEOUS) ×4 IMPLANT
FORMALIN 10 PREFIL 120ML (MISCELLANEOUS) ×2 IMPLANT
GAUZE SPONGE 4X4 16PLY XRAY LF (GAUZE/BANDAGES/DRESSINGS) ×2 IMPLANT
GLOVE BIOGEL PI IND STRL 7.0 (GLOVE) ×1 IMPLANT
GLOVE BIOGEL PI IND STRL 8 (GLOVE) ×1 IMPLANT
GLOVE BIOGEL PI INDICATOR 7.0 (GLOVE) ×1
GLOVE BIOGEL PI INDICATOR 8 (GLOVE) ×1
GLOVE ECLIPSE 8.0 STRL XLNG CF (GLOVE) ×2 IMPLANT
GLOVE EXAM NITRILE MD LF STRL (GLOVE) ×2 IMPLANT
GLOVE OPTIFIT SS 6.5 STRL BRWN (GLOVE) ×2 IMPLANT
GOWN STRL REIN XL XLG (GOWN DISPOSABLE) ×4 IMPLANT
INST SET HYSTEROSCOPY (KITS) ×2 IMPLANT
IV D5W 500ML (IV SOLUTION) ×2 IMPLANT
IV NS IRRIG 3000ML ARTHROMATIC (IV SOLUTION) ×2 IMPLANT
KIT ROOM TURNOVER APOR (KITS) ×2 IMPLANT
MANIFOLD NEPTUNE II (INSTRUMENTS) ×2 IMPLANT
MARKER SKIN DUAL TIP RULER LAB (MISCELLANEOUS) ×2 IMPLANT
NS IRRIG 1000ML POUR BTL (IV SOLUTION) ×2 IMPLANT
PACK BASIC III (CUSTOM PROCEDURE TRAY) ×1
PACK SRG BSC III STRL LF ECLPS (CUSTOM PROCEDURE TRAY) ×1 IMPLANT
PAD ARMBOARD 7.5X6 YLW CONV (MISCELLANEOUS) ×2 IMPLANT
PAD TELFA 3X4 1S STER (GAUZE/BANDAGES/DRESSINGS) ×2 IMPLANT
SET BASIN LINEN APH (SET/KITS/TRAYS/PACK) ×2 IMPLANT
SET IRRIG Y TYPE TUR BLADDER L (SET/KITS/TRAYS/PACK) ×2 IMPLANT
SHEET LAVH (DRAPES) ×2 IMPLANT
YANKAUER SUCT BULB TIP 10FT TU (MISCELLANEOUS) ×2 IMPLANT

## 2012-11-21 NOTE — Anesthesia Procedure Notes (Signed)
Procedure Name: LMA Insertion Date/Time: 11/21/2012 7:41 AM Performed by: Franco Nones Pre-anesthesia Checklist: Patient identified, Patient being monitored, Emergency Drugs available, Timeout performed and Suction available Patient Re-evaluated:Patient Re-evaluated prior to inductionOxygen Delivery Method: Circle System Utilized Preoxygenation: Pre-oxygenation with 100% oxygen Intubation Type: IV induction Ventilation: Mask ventilation without difficulty LMA: LMA inserted LMA Size: 4.0 Number of attempts: 1 Placement Confirmation: positive ETCO2 and breath sounds checked- equal and bilateral

## 2012-11-21 NOTE — Anesthesia Preprocedure Evaluation (Signed)
Anesthesia Evaluation  Patient identified by MRN, date of birth, ID band Patient awake    Reviewed: Allergy & Precautions, H&P , NPO status , Patient's Chart, lab work & pertinent test results  History of Anesthesia Complications Negative for: history of anesthetic complications  Airway Mallampati: II  TM Distance: >3 FB     Dental  (+) Teeth Intact   Pulmonary neg pulmonary ROS,  breath sounds clear to auscultation        Cardiovascular negative cardio ROS  Rhythm:Regular Rate:Normal     Neuro/Psych    GI/Hepatic negative GI ROS,   Endo/Other    Renal/GU      Musculoskeletal   Abdominal   Peds  Hematology   Anesthesia Other Findings   Reproductive/Obstetrics                             Anesthesia Physical Anesthesia Plan  ASA: I  Anesthesia Plan: General   Post-op Pain Management:    Induction: Intravenous  Airway Management Planned: LMA  Additional Equipment:   Intra-op Plan:   Post-operative Plan: Extubation in OR  Informed Consent: I have reviewed the patients History and Physical, chart, labs and discussed the procedure including the risks, benefits and alternatives for the proposed anesthesia with the patient or authorized representative who has indicated his/her understanding and acceptance.     Plan Discussed with:   Anesthesia Plan Comments:         Anesthesia Quick Evaluation  

## 2012-11-21 NOTE — Transfer of Care (Signed)
Immediate Anesthesia Transfer of Care Note  Patient: Gabrielle Richards  Procedure(s) Performed: Procedure(s) (LRB): DILATATION & CURETTAGE/HYSTEROSCOPY WITH THERMACHOICE ABLATION (N/A)  Patient Location: PACU  Anesthesia Type: General  Level of Consciousness: awake  Airway & Oxygen Therapy: Patient Spontanous Breathing and non-rebreather face mask  Post-op Assessment: Report given to PACU RN, Post -op Vital signs reviewed and stable and Patient moving all extremities  Post vital signs: Reviewed and stable  Complications: No apparent anesthesia complications

## 2012-11-21 NOTE — H&P (Signed)
Gabrielle Richards is an 42 y.o. female. Status post BTL with menometrorrhagia and dysmenorrhea.  Sonogram normal and response to megestrol was suboptimal.  Proceeding with endometrial ablation.  Patient's last menstrual period was 10/05/2012.    Past Medical History  Diagnosis Date  . Hyperlipidemia   . Bulging lumbar disc     Past Surgical History  Procedure Laterality Date  . Tubal ligation  1997    [facility]    Family History  Problem Relation Age of Onset  . Diabetes Mother   . Diabetes Brother   . Hyperlipidemia Brother     Social History:  reports that she has never smoked. She does not have any smokeless tobacco history on file. She reports that she does not drink alcohol or use illicit drugs.  Allergies: No Known Allergies  Prescriptions prior to admission  Medication Sig Dispense Refill  . diclofenac sodium (VOLTAREN) 1 % GEL Apply 1 application topically 4 (four) times daily as needed.  100 g  3  . megestrol (MEGACE) 40 MG tablet Take 40 mg by mouth daily.        ROS  Review of Systems  Constitutional: Negative for fever, chills, weight loss, malaise/fatigue and diaphoresis.  HENT: Negative for hearing loss, ear pain, nosebleeds, congestion, sore throat, neck pain, tinnitus and ear discharge.   Eyes: Negative for blurred vision, double vision, photophobia, pain, discharge and redness.  Respiratory: Negative for cough, hemoptysis, sputum production, shortness of breath, wheezing and stridor.   Cardiovascular: Negative for chest pain, palpitations, orthopnea, claudication, leg swelling and PND.  Gastrointestinal:negative for abdominal pain. Negative for heartburn, nausea, vomiting, diarrhea, constipation, blood in stool and melena.  Genitourinary: Negative for dysuria, urgency, frequency, hematuria and flank pain.  Musculoskeletal: Negative for myalgias, back pain, joint pain and falls.  Skin: Negative for itching and rash.  Neurological: Negative  for dizziness, tingling, tremors, sensory change, speech change, focal weakness, seizures, loss of consciousness, weakness and headaches.  Endo/Heme/Allergies: Negative for environmental allergies and polydipsia. Does not bruise/bleed easily.  Psychiatric/Behavioral: Negative for depression, suicidal ideas, hallucinations, memory loss and substance abuse. The patient is not nervous/anxious and does not have insomnia.     Blood pressure 133/95, temperature 98.6 F (37 C), temperature source Oral, resp. rate 17, last menstrual period 10/05/2012, SpO2 98.00%. Physical Exam Physical Exam  Vitals reviewed. Constitutional: She is oriented to person, place, and time. She appears well-developed and well-nourished.  HENT:  Head: Normocephalic and atraumatic.  Right Ear: External ear normal.  Left Ear: External ear normal.  Nose: Nose normal.  Mouth/Throat: Oropharynx is clear and moist.  Eyes: Conjunctivae and EOM are normal. Pupils are equal, round, and reactive to light. Right eye exhibits no discharge. Left eye exhibits no discharge. No scleral icterus.  Neck: Normal range of motion. Neck supple. No tracheal deviation present. No thyromegaly present.  Cardiovascular: Normal rate, regular rhythm, normal heart sounds and intact distal pulses.  Exam reveals no gallop and no friction rub.   No murmur heard. Respiratory: Effort normal and breath sounds normal. No respiratory distress. She has no wheezes. She has no rales. She exhibits no tenderness.  GI: Soft. Bowel sounds are normal. She exhibits no distension and no mass. There is tenderness. There is no rebound and no guarding.  Genitourinary:       Vulva is normal without lesions Vagina is pink moist without discharge Cervix normal in appearance and pap is normal Uterus is normal sized Adnexa is negative with normal  sized ovaries by sonogram  Musculoskeletal: Normal range of motion. She exhibits no edema and no tenderness.  Neurological: She  is alert and oriented to person, place, and time. She has normal reflexes. She displays normal reflexes. No cranial nerve deficit. She exhibits normal muscle tone. Coordination normal.  Skin: Skin is warm and dry. No rash noted. No erythema. No pallor.  Psychiatric: She has a normal mood and affect. Her behavior is normal. Judgment and thought content normal.    Results for orders placed during the hospital encounter of 11/16/12 (from the past 336 hour(s))  SURGICAL PCR SCREEN   Collection Time    11/16/12 12:00 PM      Result Value Range   MRSA, PCR NEGATIVE  NEGATIVE   Staphylococcus aureus NEGATIVE  NEGATIVE  URINE CULTURE   Collection Time    11/16/12 12:00 PM      Result Value Range   Specimen Description URINE, CLEAN CATCH     Special Requests NONE     Culture  Setup Time 11/16/2012 23:04     Colony Count 5,000 COLONIES/ML     Culture INSIGNIFICANT GROWTH     Report Status 11/17/2012 FINAL    CBC   Collection Time    11/16/12 12:00 PM      Result Value Range   WBC 7.0  4.0 - 10.5 K/uL   RBC 4.57  3.87 - 5.11 MIL/uL   Hemoglobin 13.5  12.0 - 15.0 g/dL   HCT 16.1  09.6 - 04.5 %   MCV 87.7  78.0 - 100.0 fL   MCH 29.5  26.0 - 34.0 pg   MCHC 33.7  30.0 - 36.0 g/dL   RDW 40.9  81.1 - 91.4 %   Platelets 283  150 - 400 K/uL  COMPREHENSIVE METABOLIC PANEL   Collection Time    11/16/12 12:00 PM      Result Value Range   Sodium 136  135 - 145 mEq/L   Potassium 4.0  3.5 - 5.1 mEq/L   Chloride 104  96 - 112 mEq/L   CO2 22  19 - 32 mEq/L   Glucose, Bld 86  70 - 99 mg/dL   BUN 11  6 - 23 mg/dL   Creatinine, Ser 7.82  0.50 - 1.10 mg/dL   Calcium 95.6  8.4 - 21.3 mg/dL   Total Protein 7.1  6.0 - 8.3 g/dL   Albumin 3.7  3.5 - 5.2 g/dL   AST 13  0 - 37 U/L   ALT 8  0 - 35 U/L   Alkaline Phosphatase 64  39 - 117 U/L   Total Bilirubin 0.3  0.3 - 1.2 mg/dL   GFR calc non Af Amer 75 (*) >90 mL/min   GFR calc Af Amer 87 (*) >90 mL/min  URINALYSIS, ROUTINE W REFLEX MICROSCOPIC    Collection Time    11/16/12 12:00 PM      Result Value Range   Color, Urine YELLOW  YELLOW   APPearance HAZY (*) CLEAR   Specific Gravity, Urine >1.030 (*) 1.005 - 1.030   pH 6.0  5.0 - 8.0   Glucose, UA NEGATIVE  NEGATIVE mg/dL   Hgb urine dipstick TRACE (*) NEGATIVE   Bilirubin Urine NEGATIVE  NEGATIVE   Ketones, ur NEGATIVE  NEGATIVE mg/dL   Protein, ur NEGATIVE  NEGATIVE mg/dL   Urobilinogen, UA 0.2  0.0 - 1.0 mg/dL   Nitrite NEGATIVE  NEGATIVE   Leukocytes, UA SMALL (*) NEGATIVE  HCG,  QUANTITATIVE, PREGNANCY   Collection Time    11/16/12 12:00 PM      Result Value Range   hCG, Beta Chain, Quant, S <1  <5 mIU/mL  URINE MICROSCOPIC-ADD ON   Collection Time    11/16/12 12:00 PM      Result Value Range   Squamous Epithelial / LPF MANY (*) RARE   WBC, UA 3-6  <3 WBC/hpf   RBC / HPF 3-6  <3 RBC/hpf   Bacteria, UA MANY (*) RARE       Assessment/Plan: 1.  Menometrorrhagia 2.  Dysmenorrhea  Proceed with endometrial ablation hysteroscopy curettage  EURE,LUTHER H 11/21/2012, 7:17 AM

## 2012-11-21 NOTE — Anesthesia Postprocedure Evaluation (Signed)
Anesthesia Post Note  Patient: Gabrielle Richards  Procedure(s) Performed: Procedure(s) (LRB): DILATATION & CURETTAGE/HYSTEROSCOPY WITH THERMACHOICE ABLATION (N/A)  Anesthesia type: General  Patient location: PACU  Post pain: Pain level controlled  Post assessment: Post-op Vital signs reviewed, Patient's Cardiovascular Status Stable, Respiratory Function Stable, Patent Airway, No signs of Nausea or vomiting and Pain level controlled  Last Vitals:  Filed Vitals:   11/21/12 0837  BP: 111/56  Pulse: 88  Temp: 37.2 C  Resp: 14    Post vital signs: Reviewed and stable  Level of consciousness: awake and alert   Complications: No apparent anesthesia complications

## 2012-11-21 NOTE — Op Note (Signed)
Preoperative diagnosis: Menometrorrhagia                                        Dysmenorrhea   Postoperative diagnoses: Same as above   Procedure: Hysteroscopy, uterine curettage, endometrial ablation  Surgeon: Despina Hidden MD  Anesthesia: Laryngeal mask airway  Findings: The endometrium was normal. There were no fibroid or other abnormalities.  Description of operation: The patient was taken to the operating room and placed in the supine position. She underwent general anesthesia using the laryngeal mask airway. She was placed in the dorsal lithotomy position and prepped and draped in the usual sterile fashion. A Graves speculum was placed and the anterior cervical lip was grasped with a single-tooth tenaculum. The cervix was dilated serially to allow passage of the hysteroscope. Diagnostic hysteroscopy was performed and was found to be normal. A vigorous uterine curettage was then performed and all tissue sent to pathology for evaluation. The ThermaChoice 3 endometrial ablation balloon was then used were 22 cc of D5W was required to maintain a pressure of 190-200 mm of mercury throughout the procedure. Toatl therapy time was 9:22.  All of the equipment worked well throughout the procedure. All of the fluid was returned at the end of the procedure. The patient was awakened from anesthesia and taken to the recovery room in good stable condition all counts were correct. She received 2 g of Ancef and 30 mg of Toradol preoperatively. She will be discharged from the recovery room and followed up in the office in 2 weeks.   EURE,LUTHER H 11/21/2012 8:22 AM

## 2012-11-22 ENCOUNTER — Encounter (HOSPITAL_COMMUNITY): Payer: Self-pay | Admitting: Obstetrics & Gynecology

## 2013-11-21 ENCOUNTER — Other Ambulatory Visit: Payer: Self-pay | Admitting: Obstetrics and Gynecology

## 2013-11-21 ENCOUNTER — Other Ambulatory Visit: Payer: Self-pay | Admitting: Adult Health

## 2013-11-21 DIAGNOSIS — Z1231 Encounter for screening mammogram for malignant neoplasm of breast: Secondary | ICD-10-CM

## 2013-11-23 ENCOUNTER — Ambulatory Visit (HOSPITAL_COMMUNITY): Payer: 59

## 2013-12-18 ENCOUNTER — Ambulatory Visit (HOSPITAL_COMMUNITY)
Admission: RE | Admit: 2013-12-18 | Discharge: 2013-12-18 | Disposition: A | Discharge status: Home / Self Care | Payer: 59 | Source: Ambulatory Visit | Attending: Adult Health | Admitting: Adult Health

## 2013-12-18 DIAGNOSIS — Z1231 Encounter for screening mammogram for malignant neoplasm of breast: Secondary | ICD-10-CM | POA: Insufficient documentation

## 2013-12-22 ENCOUNTER — Other Ambulatory Visit: Payer: Self-pay | Admitting: Adult Health

## 2013-12-22 DIAGNOSIS — R928 Other abnormal and inconclusive findings on diagnostic imaging of breast: Secondary | ICD-10-CM

## 2014-01-03 ENCOUNTER — Encounter (HOSPITAL_COMMUNITY): Payer: 59

## 2014-01-17 ENCOUNTER — Encounter (HOSPITAL_COMMUNITY): Payer: 59

## 2014-01-24 ENCOUNTER — Ambulatory Visit (HOSPITAL_COMMUNITY)
Admission: RE | Admit: 2014-01-24 | Discharge: 2014-01-24 | Disposition: A | Discharge status: Home / Self Care | Payer: 59 | Source: Ambulatory Visit | Attending: Adult Health | Admitting: Adult Health

## 2014-01-24 DIAGNOSIS — R928 Other abnormal and inconclusive findings on diagnostic imaging of breast: Secondary | ICD-10-CM | POA: Insufficient documentation

## 2014-03-06 ENCOUNTER — Ambulatory Visit (INDEPENDENT_AMBULATORY_CARE_PROVIDER_SITE_OTHER): Payer: 59 | Admitting: Adult Health

## 2014-03-06 ENCOUNTER — Other Ambulatory Visit (HOSPITAL_COMMUNITY)
Admission: RE | Admit: 2014-03-06 | Discharge: 2014-03-06 | Disposition: A | Discharge status: Home / Self Care | Payer: 59 | Source: Ambulatory Visit | Attending: Adult Health | Admitting: Adult Health

## 2014-03-06 ENCOUNTER — Encounter: Payer: Self-pay | Admitting: Adult Health

## 2014-03-06 VITALS — BP 110/76 | HR 76 | Ht 62.0 in | Wt 251.0 lb

## 2014-03-06 DIAGNOSIS — Z1151 Encounter for screening for human papillomavirus (HPV): Secondary | ICD-10-CM | POA: Insufficient documentation

## 2014-03-06 DIAGNOSIS — Z124 Encounter for screening for malignant neoplasm of cervix: Secondary | ICD-10-CM | POA: Insufficient documentation

## 2014-03-06 DIAGNOSIS — Z113 Encounter for screening for infections with a predominantly sexual mode of transmission: Secondary | ICD-10-CM | POA: Insufficient documentation

## 2014-03-06 DIAGNOSIS — Z1212 Encounter for screening for malignant neoplasm of rectum: Secondary | ICD-10-CM

## 2014-03-06 DIAGNOSIS — Z8742 Personal history of other diseases of the female genital tract: Secondary | ICD-10-CM

## 2014-03-06 DIAGNOSIS — R609 Edema, unspecified: Secondary | ICD-10-CM | POA: Insufficient documentation

## 2014-03-06 DIAGNOSIS — Z01419 Encounter for gynecological examination (general) (routine) without abnormal findings: Secondary | ICD-10-CM

## 2014-03-06 HISTORY — DX: Personal history of other diseases of the female genital tract: Z87.42

## 2014-03-06 HISTORY — DX: Edema, unspecified: R60.9

## 2014-03-06 LAB — HEMOCCULT GUIAC POC 1CARD (OFFICE): Fecal Occult Blood, POC: NEGATIVE

## 2014-03-06 NOTE — Progress Notes (Signed)
Patient ID: Jones BroomJuliette F Richards, female   DOB: 04/13/1971, 43 y.o.   MRN: [id number] History of Present Illness: Gabrielle Richards is a 43 year old black female in for pap and physical.She is complaining of swelling in feet and ankles and some weight gain.[place] got [place] from [place] son graduated high school.She has appt with Dr Jeanice Limurham in am.   Current Medications, Allergies, Past Medical History, Past Surgical History, Family History and Social History were reviewed in [name].     Review of Systems: Patient denies any headaches, blurred vision, shortness of breath, chest pain, abdominal pain, problems with bowel movements, urination, or intercourse. No joint pain or mood swings, see HPI for positives.Only 4 lbs since last year but about 12 since 10/13.And she is sp ablation and is happy with no periods.    Physical Exam:BP 110/76  Pulse 76  Ht 5\' 2"  (1.575 m)  Wt 251 lb (113.853 kg)  BMI 45.90 kg/m2 General:  Well developed, well nourished, no acute distress Skin:  Warm and dry Neck:  Midline trachea, normal thyroid Lungs; Clear to auscultation bilaterally Breast:  No dominant palpable mass, retraction, or nipple discharge Cardiovascular: Regular rate and rhythm Abdomen:  Soft, non tender, no hepatosplenomegaly Pelvic:  External genitalia is normal in appearance.  The vagina has good color, moisture and rugae.The cervix is bulbous, pap with HPV and GC/CHL performed.  Uterus is felt to be normal size, shape, and contour.  No adnexal masses or tenderness noted. Rectal: Good sphincter tone, no polyps, or hemorrhoids felt.  Hemoccult negative. Extremities:  No varicosities noted, but has swelling in bilateral feet and ankles, no pitting,  Psych:  No mood changes, alert and cooperatives, seems happy   Impression: Yearly gyn exam  Swelling History of abnormal pap    Plan: Physical in 1 year  Mammogram yearly, but has a 6 month follow up for calcifications  Labs with  PCP Colonoscopy at 48  Decrease salt and processed foods and increase water

## 2014-03-06 NOTE — Patient Instructions (Signed)
Peripheral Edema You have swelling in your legs (peripheral edema). This swelling is due to excess accumulation of salt and water in your body. Edema may be a sign of heart, kidney or liver disease, or a side effect of a medication. It may also be due to problems in the leg veins. Elevating your legs and using special support stockings may be very helpful, if the cause of the swelling is due to poor venous circulation. Avoid long periods of standing, whatever the cause. Treatment of edema depends on identifying the cause. Chips, pretzels, pickles and other salty foods should be avoided. Restricting salt in your diet is almost always needed. Water pills (diuretics) are often used to remove the excess salt and water from your body via urine. These medicines prevent the kidney from reabsorbing sodium. This increases urine flow. Diuretic treatment may also result in lowering of potassium levels in your body. Potassium supplements may be needed if you have to use diuretics daily. Daily weights can help you keep track of your progress in clearing your edema. You should call your caregiver for follow up care as recommended. SEEK IMMEDIATE MEDICAL CARE IF:   You have increased swelling, pain, redness, or heat in your legs.  You develop shortness of breath, especially when lying down.  You develop chest or abdominal pain, weakness, or fainting.  You have a fever. Document Released: 10/15/2004 Document Revised: 11/30/2011 Document Reviewed: 09/25/2009 [facility] Patient Information 2014 [employer], [place]. Physical in 1 year Mammogram yearly  Labs with PCP  Colonoscopy at 48 Decrease salt and processed foods and increase water

## 2014-03-07 ENCOUNTER — Encounter: Payer: Self-pay | Admitting: Family Medicine

## 2014-03-07 ENCOUNTER — Ambulatory Visit (INDEPENDENT_AMBULATORY_CARE_PROVIDER_SITE_OTHER): Payer: 59 | Admitting: Family Medicine

## 2014-03-07 VITALS — BP 108/60 | HR 78 | Temp 98.3°F | Resp 14 | Ht 61.0 in | Wt 252.0 lb

## 2014-03-07 DIAGNOSIS — Z23 Encounter for immunization: Secondary | ICD-10-CM

## 2014-03-07 DIAGNOSIS — Z1321 Encounter for screening for nutritional disorder: Secondary | ICD-10-CM

## 2014-03-07 DIAGNOSIS — E669 Obesity, unspecified: Secondary | ICD-10-CM | POA: Insufficient documentation

## 2014-03-07 DIAGNOSIS — R609 Edema, unspecified: Secondary | ICD-10-CM

## 2014-03-07 DIAGNOSIS — Z0000 Encounter for general adult medical examination without abnormal findings: Secondary | ICD-10-CM

## 2014-03-07 DIAGNOSIS — Z1329 Encounter for screening for other suspected endocrine disorder: Secondary | ICD-10-CM

## 2014-03-07 DIAGNOSIS — Z13228 Encounter for screening for other metabolic disorders: Secondary | ICD-10-CM

## 2014-03-07 DIAGNOSIS — E785 Hyperlipidemia, unspecified: Secondary | ICD-10-CM

## 2014-03-07 DIAGNOSIS — Z130 Encounter for screening for diseases of the blood and blood-forming organs and certain disorders involving the immune mechanism: Secondary | ICD-10-CM

## 2014-03-07 LAB — TSH: TSH: 1.023 u[IU]/mL (ref 0.350–4.500)

## 2014-03-07 LAB — CBC WITH DIFFERENTIAL/PLATELET
Basophils Absolute: 0.1 10*3/uL (ref 0.0–0.1)
Basophils Relative: 1 % (ref 0–1)
EOS PCT: 2 % (ref 0–5)
Eosinophils Absolute: 0.1 10*3/uL (ref 0.0–0.7)
HCT: 38.5 % (ref 36.0–46.0)
Hemoglobin: 12.7 g/dL (ref 12.0–15.0)
LYMPHS ABS: 2.1 10*3/uL (ref 0.7–4.0)
LYMPHS PCT: 33 % (ref 12–46)
MCH: 29.3 pg (ref 26.0–34.0)
MCHC: 33 g/dL (ref 30.0–36.0)
MCV: 88.7 fL (ref 78.0–100.0)
Monocytes Absolute: 0.5 10*3/uL (ref 0.1–1.0)
Monocytes Relative: 8 % (ref 3–12)
NEUTROS ABS: 3.6 10*3/uL (ref 1.7–7.7)
NEUTROS PCT: 56 % (ref 43–77)
PLATELETS: 302 10*3/uL (ref 150–400)
RBC: 4.34 MIL/uL (ref 3.87–5.11)
RDW: 14.1 % (ref 11.5–15.5)
WBC: 6.5 10*3/uL (ref 4.0–10.5)

## 2014-03-07 LAB — COMPREHENSIVE METABOLIC PANEL
ALBUMIN: 3.8 g/dL (ref 3.5–5.2)
ALK PHOS: 56 U/L (ref 39–117)
ALT: 12 U/L (ref 0–35)
AST: 13 U/L (ref 0–37)
BUN: 12 mg/dL (ref 6–23)
CALCIUM: 9.1 mg/dL (ref 8.4–10.5)
CHLORIDE: 105 meq/L (ref 96–112)
CO2: 26 mEq/L (ref 19–32)
Creat: 0.77 mg/dL (ref 0.50–1.10)
GLUCOSE: 96 mg/dL (ref 70–99)
Potassium: 4.8 mEq/L (ref 3.5–5.3)
Sodium: 137 mEq/L (ref 135–145)
Total Bilirubin: 0.3 mg/dL (ref 0.2–1.2)
Total Protein: 6.8 g/dL (ref 6.0–8.3)

## 2014-03-07 LAB — LIPID PANEL
CHOL/HDL RATIO: 3.8 ratio
Cholesterol: 211 mg/dL — ABNORMAL HIGH (ref 0–200)
HDL: 56 mg/dL (ref >39)
LDL Cholesterol: 135 mg/dL — ABNORMAL HIGH (ref 0–99)
TRIGLYCERIDES: 102 mg/dL (ref <150)
VLDL: 20 mg/dL (ref 0–40)

## 2014-03-07 LAB — CYTOLOGY - PAP

## 2014-03-07 MED ORDER — LORCASERIN HCL 10 MG PO TABS: ORAL_TABLET | ORAL | Status: DC

## 2014-03-07 MED ORDER — HYDROCHLOROTHIAZIDE 12.5 MG PO CAPS: 12.5000 mg | ORAL_CAPSULE | Freq: Every day | ORAL | Status: DC

## 2014-03-07 NOTE — Assessment & Plan Note (Signed)
We had a long discussion about dietary changes low-calorie diet. I will start her on Belviq 10 mg twice a day have given her a coupon for 15 day trial. She will followup in 2 months She is restart his with her morbid obesity of hyperlipidemia

## 2014-03-07 NOTE — Assessment & Plan Note (Signed)
Check fasting lipid panel she's had elevated cholesterol in the past but was not started on any medication
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
## 2014-03-07 NOTE — Assessment & Plan Note (Signed)
Pap smear done by GYN. Tetanus booster given fasting labs today

## 2014-03-07 NOTE — Assessment & Plan Note (Signed)
She's getting some pedal edema I think this is secondary to her habitus as well as being up on her feet. I did give her low-dose hydrochlorothiazide 12.5 mg to start after I see her renal function we will uses as needed and she does have low normal tensive blood pressure

## 2014-03-07 NOTE — Patient Instructions (Signed)
I recommend eye visit once a year I recommend dental visit every 6 months Goal is to  Exercise 30 minutes 5 days a week We will send a letter with lab results  Do not start weight loss medication until we call HCTZ 12.5mg  as needed for swelling F/U 2 months

## 2014-03-08 ENCOUNTER — Telehealth: Payer: Self-pay | Admitting: Adult Health

## 2014-03-08 LAB — VITAMIN D 25 HYDROXY (VIT D DEFICIENCY, FRACTURES): Vit D, 25-Hydroxy: 21 ng/mL — ABNORMAL LOW (ref 30–89)

## 2014-03-08 MED ORDER — AZITHROMYCIN 500 MG PO TABS: ORAL_TABLET | ORAL | Status: DC

## 2014-03-08 NOTE — Telephone Encounter (Signed)
Pt called back and said to treat partner Atilano InaMarkus Perkins [birth year], called Rx azithromycin 500 mg #2 2 po now to [facility], [facility]

## 2014-03-08 NOTE — Telephone Encounter (Signed)
Pt aware of +chlamydia rx azithromycin 500 mg #2 2 po now POT 7/16 at 4 pm to let me know about partner

## 2014-03-09 ENCOUNTER — Other Ambulatory Visit: Payer: Self-pay | Admitting: *Deleted

## 2014-03-09 MED ORDER — VITAMIN D (ERGOCALCIFEROL) 1.25 MG (50000 UNIT) PO CAPS: 50000.0000 [IU] | ORAL_CAPSULE | ORAL | Status: DC

## 2014-04-05 ENCOUNTER — Encounter: Payer: Self-pay | Admitting: Adult Health

## 2014-04-05 ENCOUNTER — Ambulatory Visit (INDEPENDENT_AMBULATORY_CARE_PROVIDER_SITE_OTHER): Payer: 59 | Admitting: Adult Health

## 2014-04-05 VITALS — BP 122/78 | Ht 62.0 in | Wt 246.5 lb

## 2014-04-05 DIAGNOSIS — A749 Chlamydial infection, unspecified: Secondary | ICD-10-CM | POA: Insufficient documentation

## 2014-04-05 HISTORY — DX: Chlamydial infection, unspecified: A74.9

## 2014-04-05 NOTE — Progress Notes (Signed)
Subjective:     Patient ID: Gabrielle Richards, female   DOB: 04/17/1971, 43 y.o.   MRN: [id number]  HPI Gabrielle Richards is a 43 year old black female who was treated for chlamydia, in to day for proof of treatment.Has recently started belviq and has lost about 5 lbs.  Review of Systems See HPI Reviewed past medical,surgical, social and family history. Reviewed medications and allergies.     Objective:   Physical Exam BP 122/78  Ht 5\' 2"  (1.575 m)  Wt 246 lb 8 oz (111.812 kg)  BMI 45.07 kg/m2   Took meds no complaints or problems, will send GC/CHL  Assessment:     Proof of treatment for chlamydia    Plan:     GC/CHL sent  Follow up prn

## 2014-04-05 NOTE — Patient Instructions (Signed)
Follow up prn

## 2014-04-06 ENCOUNTER — Other Ambulatory Visit: Payer: Self-pay | Admitting: Family Medicine

## 2014-04-06 NOTE — Telephone Encounter (Signed)
okay

## 2014-04-06 NOTE — Telephone Encounter (Signed)
Ok to refill??  Last office visit 12/10/2013.  Last refill 03/07/2014.

## 2014-04-07 LAB — GC/CHLAMYDIA PROBE AMP
CT Probe RNA: NEGATIVE
GC PROBE AMP APTIMA: NEGATIVE

## 2014-04-09 ENCOUNTER — Telehealth: Payer: Self-pay | Admitting: Adult Health

## 2014-04-09 ENCOUNTER — Other Ambulatory Visit: Payer: Self-pay | Admitting: Family Medicine

## 2014-04-09 NOTE — Telephone Encounter (Signed)
Left message tests negative 

## 2014-04-09 NOTE — Telephone Encounter (Signed)
Prescription printed and patient made aware to come to office to pick up.  

## 2014-04-11 ENCOUNTER — Telehealth: Payer: Self-pay | Admitting: Family Medicine

## 2014-04-11 NOTE — Telephone Encounter (Signed)
Patient is saying that her pharmacy faxed over request for her belviq and they have not heard anything from us about it please call patient and let her know  What is going on with this [phone] [facility]

## 2014-04-11 NOTE — Telephone Encounter (Signed)
Prescription faxed per patient request.   Call placed to patient to make aware.   LMTRC.

## 2014-04-11 NOTE — Telephone Encounter (Signed)
Patient returned call and made aware.

## 2014-05-07 ENCOUNTER — Encounter: Payer: Self-pay | Admitting: Family Medicine

## 2014-05-07 ENCOUNTER — Ambulatory Visit (INDEPENDENT_AMBULATORY_CARE_PROVIDER_SITE_OTHER): Payer: 59 | Admitting: Family Medicine

## 2014-05-07 DIAGNOSIS — N898 Other specified noninflammatory disorders of vagina: Secondary | ICD-10-CM

## 2014-05-07 DIAGNOSIS — N939 Abnormal uterine and vaginal bleeding, unspecified: Secondary | ICD-10-CM | POA: Insufficient documentation

## 2014-05-07 MED ORDER — HYDROCHLOROTHIAZIDE 12.5 MG PO CAPS: 12.5000 mg | ORAL_CAPSULE | Freq: Every day | ORAL | Status: DC

## 2014-05-07 MED ORDER — LORCASERIN HCL 10 MG PO TABS: ORAL_TABLET | ORAL | Status: DC

## 2014-05-07 NOTE — Progress Notes (Signed)
Patient ID: Gabrielle Richards, female   DOB: 06/17/1971, 43 y.o.   MRN: [id number]   Subjective:    Patient ID: Gabrielle BroomJuliette F Meharg, female    DOB: 05/24/1971, 43 y.o.   MRN: [id number]  Patient presents for 2 month F/U Started on Belviq for weight loss, had HA first week but now resolved. She has been on for 6 weeks now. Has lost 10lbs. Exercising 30 minutes 3 days a week, has changed diet, medication helps curb appetite.  HCTZ helps with ankle swelling, uses when needed  Had mild spotting since she started medication, had ablation last year, no abd or pelvic pain, had GYN visit a few months ago normal PAP     Review Of Systems:  GEN- denies fatigue, fever, weight loss,weakness, recent illness HEENT- denies eye drainage, change in vision, nasal discharge, CVS- denies chest pain, palpitations RESP- denies SOB, cough, wheeze ABD- denies N/V, change in stools, abd pain GU- denies dysuria, hematuria, dribbling, incontinence MSK- denies joint pain, muscle aches, injury Neuro- denies headache, dizziness, syncope, seizure activity       Objective:    BP 122/68  Pulse 78  Temp(Src) 98.2 F (36.8 C) (Oral)  Resp 14  Ht 5\' 1"  (1.549 m)  Wt 243 lb (110.224 kg)  BMI 45.94 kg/m2 GEN- NAD, alert and oriented x3 CVS-RRR, no murmur RESP-CTAB EXT- No edema         Assessment & Plan:      Problem List Items Addressed This Visit   None      Note: This dictation was prepared with Dragon dictation along with smaller phrase technology. Any transcriptional errors that result from this process are unintentional.

## 2014-05-07 NOTE — Patient Instructions (Signed)
Vitamin D 1000IU once a day  Continue belviq  F/U 3 months

## 2014-05-07 NOTE — Assessment & Plan Note (Signed)
Weight loss noted, doing well with medication, continue Belviq, f/u in 3 months, increase cardio to 5 days a week

## 2014-05-07 NOTE — Assessment & Plan Note (Signed)
I doubt related to the medications, will refer her back to GYN if this does not resolve

## 2014-07-23 ENCOUNTER — Encounter: Payer: Self-pay | Admitting: Family Medicine

## 2014-08-07 ENCOUNTER — Ambulatory Visit (INDEPENDENT_AMBULATORY_CARE_PROVIDER_SITE_OTHER): Payer: 59 | Admitting: Family Medicine

## 2014-08-07 ENCOUNTER — Encounter: Payer: Self-pay | Admitting: Family Medicine

## 2014-08-07 VITALS — BP 118/68 | HR 76 | Temp 97.5°F | Resp 14 | Ht 61.0 in | Wt 238.0 lb

## 2014-08-07 DIAGNOSIS — M545 Low back pain, unspecified: Secondary | ICD-10-CM

## 2014-08-07 DIAGNOSIS — E785 Hyperlipidemia, unspecified: Secondary | ICD-10-CM

## 2014-08-07 DIAGNOSIS — M542 Cervicalgia: Secondary | ICD-10-CM

## 2014-08-07 LAB — LIPID PANEL
Cholesterol: 208 mg/dL — ABNORMAL HIGH (ref 0–200)
HDL: 52 mg/dL (ref >39)
LDL Cholesterol: 135 mg/dL — ABNORMAL HIGH (ref 0–99)
Total CHOL/HDL Ratio: 4 Ratio
Triglycerides: 106 mg/dL (ref <150)
VLDL: 21 mg/dL (ref 0–40)

## 2014-08-07 MED ORDER — HYDROCHLOROTHIAZIDE 12.5 MG PO CAPS: 12.5000 mg | ORAL_CAPSULE | Freq: Every day | ORAL | Status: DC

## 2014-08-07 MED ORDER — DICLOFENAC SODIUM 1 % TD GEL: 1.0000 "application " | Freq: Four times a day (QID) | TRANSDERMAL | Status: DC | PRN

## 2014-08-07 MED ORDER — LORCASERIN HCL 10 MG PO TABS: ORAL_TABLET | ORAL | Status: DC

## 2014-08-07 NOTE — Patient Instructions (Signed)
We will call with cholesterol labs Continue Belviq- goal 10lbs by next visit F/U Feb 2016

## 2014-08-07 NOTE — Assessment & Plan Note (Signed)
Continue Belviq for another 3 months Goal another 10 pounds down
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
## 2014-08-07 NOTE — Assessment & Plan Note (Signed)
Recheck lipids, dietary changes made

## 2014-08-09 ENCOUNTER — Encounter: Payer: Self-pay | Admitting: *Deleted

## 2014-08-10 ENCOUNTER — Other Ambulatory Visit: Payer: Self-pay | Admitting: Family Medicine

## 2014-08-10 DIAGNOSIS — Z09 Encounter for follow-up examination after completed treatment for conditions other than malignant neoplasm: Secondary | ICD-10-CM

## 2014-08-28 ENCOUNTER — Encounter (HOSPITAL_COMMUNITY): Payer: 59

## 2014-08-28 ENCOUNTER — Ambulatory Visit (HOSPITAL_COMMUNITY)
Admission: RE | Admit: 2014-08-28 | Discharge: 2014-08-28 | Disposition: A | Discharge status: Home / Self Care | Payer: 59 | Source: Ambulatory Visit | Attending: Family Medicine | Admitting: Family Medicine

## 2014-08-28 ENCOUNTER — Other Ambulatory Visit: Payer: Self-pay | Admitting: Family Medicine

## 2014-08-28 DIAGNOSIS — Z09 Encounter for follow-up examination after completed treatment for conditions other than malignant neoplasm: Secondary | ICD-10-CM

## 2014-08-28 DIAGNOSIS — R928 Other abnormal and inconclusive findings on diagnostic imaging of breast: Secondary | ICD-10-CM | POA: Diagnosis not present

## 2014-08-28 DIAGNOSIS — R921 Mammographic calcification found on diagnostic imaging of breast: Secondary | ICD-10-CM

## 2014-10-09 DIAGNOSIS — Z029 Encounter for administrative examinations, unspecified: Secondary | ICD-10-CM

## 2014-11-14 ENCOUNTER — Encounter: Payer: Self-pay | Admitting: Family Medicine

## 2014-11-14 ENCOUNTER — Ambulatory Visit (INDEPENDENT_AMBULATORY_CARE_PROVIDER_SITE_OTHER): Payer: 59 | Admitting: Family Medicine

## 2014-11-14 DIAGNOSIS — R609 Edema, unspecified: Secondary | ICD-10-CM

## 2014-11-14 DIAGNOSIS — E785 Hyperlipidemia, unspecified: Secondary | ICD-10-CM

## 2014-11-14 LAB — COMPREHENSIVE METABOLIC PANEL
ALBUMIN: 3.8 g/dL (ref 3.5–5.2)
ALK PHOS: 61 U/L (ref 39–117)
ALT: <8 U/L (ref 0–35)
AST: 13 U/L (ref 0–37)
BILIRUBIN TOTAL: 0.3 mg/dL (ref 0.2–1.2)
BUN: 10 mg/dL (ref 6–23)
CALCIUM: 9.5 mg/dL (ref 8.4–10.5)
CO2: 25 mEq/L (ref 19–32)
Chloride: 100 mEq/L (ref 96–112)
Creat: 0.73 mg/dL (ref 0.50–1.10)
Glucose, Bld: 70 mg/dL (ref 70–99)
POTASSIUM: 4.2 meq/L (ref 3.5–5.3)
SODIUM: 134 meq/L — AB (ref 135–145)
Total Protein: 7 g/dL (ref 6.0–8.3)

## 2014-11-14 LAB — CBC WITH DIFFERENTIAL/PLATELET
Basophils Absolute: 0 10*3/uL (ref 0.0–0.1)
Basophils Relative: 0 % (ref 0–1)
Eosinophils Absolute: 0.3 10*3/uL (ref 0.0–0.7)
Eosinophils Relative: 3 % (ref 0–5)
HEMATOCRIT: 39 % (ref 36.0–46.0)
Hemoglobin: 13.2 g/dL (ref 12.0–15.0)
LYMPHS PCT: 16 % (ref 12–46)
Lymphs Abs: 1.7 10*3/uL (ref 0.7–4.0)
MCH: 30.1 pg (ref 26.0–34.0)
MCHC: 33.8 g/dL (ref 30.0–36.0)
MCV: 88.8 fL (ref 78.0–100.0)
MPV: 10 fL (ref 8.6–12.4)
Monocytes Absolute: 1.2 10*3/uL — ABNORMAL HIGH (ref 0.1–1.0)
Monocytes Relative: 11 % (ref 3–12)
Neutro Abs: 7.5 10*3/uL (ref 1.7–7.7)
Neutrophils Relative %: 70 % (ref 43–77)
Platelets: 299 10*3/uL (ref 150–400)
RBC: 4.39 MIL/uL (ref 3.87–5.11)
RDW: 13.7 % (ref 11.5–15.5)
WBC: 10.7 10*3/uL — ABNORMAL HIGH (ref 4.0–10.5)

## 2014-11-14 LAB — LIPID PANEL
CHOL/HDL RATIO: 3.2 ratio
CHOLESTEROL: 206 mg/dL — AB (ref 0–200)
HDL: 64 mg/dL (ref >=46)
LDL Cholesterol: 124 mg/dL — ABNORMAL HIGH (ref 0–99)
Triglycerides: 90 mg/dL (ref <150)
VLDL: 18 mg/dL (ref 0–40)

## 2014-11-14 NOTE — Patient Instructions (Addendum)
We will call with lab results Low calorie diet, talk to nutritionist Continue current medication F/U 3 months

## 2014-11-14 NOTE — Progress Notes (Signed)
Patient ID: Gabrielle Richards Mcinerney, female   DOB: 03/28/1971, 44 y.o.   MRN: [id number]   Subjective:    Patient ID: Gabrielle Richards Cantero, female    DOB: 07/27/1971, 44 y.o.   MRN: [id number]  Patient presents for 3 month Richards/U  Pt here to Richards/u weight, she has lost 7lbs in 6 months on Belviq, she has not been monitoring her diet, and has very little exercise. 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   Hyperlipidemia- due for repeat labs, no current meds  Leg swelling- doing well with HCTZ once a day     Review Of Systems:  GEN- denies fatigue, fever, weight loss,weakness, recent illness HEENT- denies eye drainage, change in vision, nasal discharge, CVS- denies chest pain, palpitations RESP- denies SOB, cough, wheeze ABD- denies N/V, change in stools, abd pain GU- denies dysuria, hematuria, dribbling, incontinence MSK- +joint pain, muscle aches, injury Neuro- denies headache, dizziness, syncope, seizure activity       Objective:    BP 126/74 mmHg  Pulse 76  Temp(Src) 98.5 Richards (36.9 C) (Oral)  Resp 14  Ht 5\' 1"  (1.549 m)  Wt 236 lb (107.049 kg)  BMI 44.61 kg/m2 GEN- NAD, alert and oriented x3 HEENT- PERRL, EOMI, non injected sclera, pink conjunctiva, MMM, oropharynx clear Neck- Supple, no thyromegaly CVS- RRR, no murmur RESP-CTAB ABD-NABS,soft,NT,ND EXT- No edema Pulses- Radial, DP- 2+        Assessment & Plan:      Problem List Items Addressed This Visit      Unprioritized   Swelling   Morbid obesity   Relevant Orders   CBC with Differential/Platelet   Comprehensive metabolic panel   Lipid panel   Hyperlipidemia - Primary   Relevant Orders   CBC with Differential/Platelet   Comprehensive metabolic panel   Lipid panel      Note: This dictation was prepared with Dragon dictation along with smaller phrase technology. Any transcriptional errors that result from this process are unintentional.

## 2014-11-15 NOTE — Assessment & Plan Note (Signed)
Recheck cholesterol levels. 

## 2014-11-15 NOTE — Assessment & Plan Note (Signed)
Her swelling is well controlled with hydrochlorothiazide she will continue this

## 2014-11-15 NOTE — Assessment & Plan Note (Signed)
She has not been very compliant with this at dietary changes need for weight loss. She still eating too many calories too much junk food. She is also not exercising on a regular basis. I discussed with her that even though she is down 6 pounds with the use of the dietary aide she should have more significant weight loss. She has 1 more refill and she wants to commit to changing her diet and following with her nutritionist for the next 4 weeks to see if she can lose weight properly. I will go ahead and allow the last refill on the medication.

## 2014-12-17 ENCOUNTER — Other Ambulatory Visit: Payer: 59 | Admitting: Adult Health

## 2015-02-13 ENCOUNTER — Ambulatory Visit (INDEPENDENT_AMBULATORY_CARE_PROVIDER_SITE_OTHER): Payer: 59 | Admitting: Family Medicine

## 2015-02-13 ENCOUNTER — Encounter: Payer: Self-pay | Admitting: Family Medicine

## 2015-02-13 VITALS — BP 122/70 | HR 76 | Temp 98.0°F | Resp 12 | Ht 61.0 in | Wt 240.0 lb

## 2015-02-13 DIAGNOSIS — F4321 Adjustment disorder with depressed mood: Secondary | ICD-10-CM | POA: Diagnosis not present

## 2015-02-13 MED ORDER — NALTREXONE-BUPROPION HCL ER 8-90 MG PO TB12
ORAL_TABLET | ORAL | Status: DC
Start: 2015-02-13 — End: 2015-05-15

## 2015-02-13 NOTE — Assessment & Plan Note (Signed)
Trial of Wellbutrin which is noted in the Contrave Given titration schedule and information for the contrave

## 2015-02-13 NOTE — Progress Notes (Signed)
Patient ID: Jones BroomJuliette F Richards, female   DOB: 09/10/1971, 44 y.o.   MRN: [id number]   Subjective:    Patient ID: Jones BroomJuliette F Richards, female    DOB: 02/17/1971, 44 y.o.   MRN: [id number]  Patient presents for 3 month F/U  patient here to follow-up. At her last visit I gave her another refill on the Belviq However she is actually gained 4 pounds she's been off the medicine for the past couple months. She's not been following with the nutritionist she states that there is not one available all the time for her to speak to. She also admits that she had been depressed lately and had been emotionally eating as her grandmother who help take care of her children passed away earlier this year and this is taken a toll on the family. She often gets on binges of fast food or when she is upset she will just eat whatever junk food. She's been trying to walk some but has not been diligent with this either. She also had some changes at her job which also causes her distress. She understands that she needs to work on her weight loss and is dedicated to doing this. She is open to trying something different to help with her weight loss.      Review Of Systems:  GEN- denies fatigue, fever, weight loss,weakness, recent illness HEENT- denies eye drainage, change in vision, nasal discharge, CVS- denies chest pain, palpitations RESP- denies SOB, cough, wheeze ABD- denies N/V, change in stools, abd pain GU- denies dysuria, hematuria, dribbling, incontinence MSK- denies joint pain, muscle aches, injury Neuro- denies headache, dizziness, syncope, seizure activity       Objective:    BP 122/70 mmHg  Pulse 76  Temp(Src) 98 F (36.7 C) (Oral)  Resp 12  Ht 5\' 1"  (1.549 m)  Wt 240 lb (108.863 kg)  BMI 45.37 kg/m2 GEN- NAD, alert and oriented x3 CVS- RRR, no murmur RESP-CTAB EXT- No edema Psych- normal affect and mood/no SI Pulses- Radial, DP- 2+        Assessment & Plan:      Problem List Items Addressed  This Visit    None      Note: This dictation was prepared with Dragon dictation along with smaller phrase technology. Any transcriptional errors that result from this process are unintentional.

## 2015-02-13 NOTE — Assessment & Plan Note (Signed)
I spent approximately 25 minutes with her discussing nutrition meal planning and exercise. We also discussed medications. I'm going to try her on the new Contrave, which has the bupropion in it which will also help her mood. We'll also get a see if there is another nutritionist at the hospital that she can follow-up with. She will also start walking with 3 days a week. Our goal is 5-10% of her body weight off in 12 weeks. I do think that she is committed to trying to lose weight as she has done well in the past.

## 2015-02-13 NOTE — Patient Instructions (Addendum)
Try the contrave- take as prescribed  Referral to nutritionist  F/U 12 weeks - COme fasting

## 2015-03-05 ENCOUNTER — Ambulatory Visit (HOSPITAL_COMMUNITY)
Admission: RE | Admit: 2015-03-05 | Discharge: 2015-03-05 | Disposition: A | Discharge status: Home / Self Care | Payer: 59 | Source: Ambulatory Visit | Attending: Family Medicine | Admitting: Family Medicine

## 2015-03-05 ENCOUNTER — Telehealth: Payer: Self-pay | Admitting: Family Medicine

## 2015-03-05 DIAGNOSIS — R921 Mammographic calcification found on diagnostic imaging of breast: Secondary | ICD-10-CM | POA: Diagnosis present

## 2015-03-05 DIAGNOSIS — Z09 Encounter for follow-up examination after completed treatment for conditions other than malignant neoplasm: Secondary | ICD-10-CM

## 2015-03-05 NOTE — Telephone Encounter (Signed)
Call placed to patient. LMTRC.  

## 2015-03-05 NOTE — Telephone Encounter (Signed)
Patient is calling to ask some questions about dosage of her diet pill that was prescribed please call her at [phone]

## 2015-03-06 NOTE — Telephone Encounter (Signed)
Call placed to patient.   Patient was started on Contrave with taper schedule. Requested clarification on dosing for the beginning of 5th week which will be on 03/10/2015.  MD states that patient should be taking (2) tabs PO BID.   Patient made aware.

## 2015-03-14 ENCOUNTER — Encounter: Payer: Self-pay | Admitting: Adult Health

## 2015-03-14 ENCOUNTER — Ambulatory Visit (INDEPENDENT_AMBULATORY_CARE_PROVIDER_SITE_OTHER): Payer: 59 | Admitting: Adult Health

## 2015-03-14 VITALS — BP 130/88 | HR 80 | Ht 62.0 in | Wt 236.5 lb

## 2015-03-14 DIAGNOSIS — Z01419 Encounter for gynecological examination (general) (routine) without abnormal findings: Secondary | ICD-10-CM | POA: Diagnosis not present

## 2015-03-14 DIAGNOSIS — Z1211 Encounter for screening for malignant neoplasm of colon: Secondary | ICD-10-CM | POA: Diagnosis not present

## 2015-03-14 DIAGNOSIS — N921 Excessive and frequent menstruation with irregular cycle: Secondary | ICD-10-CM

## 2015-03-14 HISTORY — DX: Excessive and frequent menstruation with irregular cycle: N92.1

## 2015-03-14 LAB — HEMOCCULT GUIAC POC 1CARD (OFFICE): Fecal Occult Blood, POC: NEGATIVE

## 2015-03-14 NOTE — Patient Instructions (Signed)
Menorrhagia Menorrhagia is the medical term for when your menstrual periods are heavy or last longer than usual. With menorrhagia, every period you have may cause enough blood loss and cramping that you are unable to maintain your usual activities. CAUSES  In some cases, the cause of heavy periods is unknown, but a number of conditions may cause menorrhagia. Common causes include:  A problem with the hormone-producing thyroid gland (hypothyroid). Noncancerous growths in the uterus (polyps or fibroids).Menopause Menopause is the normal time of life when menstrual periods stop completely. Menopause is complete when you have missed 12 consecutive menstrual periods. It usually occurs between the ages of 48 years and 55 years. Very rarely does a woman develop menopause before the age of 40 years. At menopause, your ovaries stop producing the female hormones estrogen and progesterone. This can cause undesirable symptoms and also affect your health. Sometimes the symptoms may occur 4-5 years before the menopause begins. There is no relationship between menopause and: Oral contraceptives. Number of children you had. Race. The age your menstrual periods started (menarche). Heavy smokers and very thin women may develop menopause earlier in life. CAUSES The ovaries stop producing the female hormones estrogen and progesterone. Other causes include: Surgery to remove both ovaries. The ovaries stop functioning for no known reason. Tumors of the pituitary gland in the brain. Medical disease that affects the ovaries and hormone production. Radiation treatment to the abdomen or pelvis. Chemotherapy that affects the ovaries. SYMPTOMS  Hot flashes. Night sweats. Decrease in sex drive. Vaginal dryness and thinning of the vagina causing painful intercourse. Dryness of the skin and developing wrinkles. Headaches. Tiredness. Irritability. Memory problems. Weight gain. Bladder infections. Hair growth of  the face and chest. Infertility. More serious symptoms include: Loss of bone (osteoporosis) causing breaks (fractures). Depression. Hardening and narrowing of the arteries (atherosclerosis) causing heart attacks and strokes. DIAGNOSIS  When the menstrual periods have stopped for 12 straight months. Physical exam. Hormone studies of the blood. TREATMENT  There are many treatment choices and nearly as many questions about them. The decisions to treat or not to treat menopausal changes is an individual choice made with your health care provider. Your health care provider can discuss the treatments with you. Together, you can decide which treatment will work best for you. Your treatment choices may include:  Hormone therapy (estrogen and progesterone). Non-hormonal medicines. Treating the individual symptoms with medicine (for example antidepressants for depression). Herbal medicines that may help specific symptoms. Counseling by a psychiatrist or psychologist. Group therapy. Lifestyle changes including: Eating healthy. Regular exercise. Limiting caffeine and alcohol. Stress management and meditation. No treatment. HOME CARE INSTRUCTIONS  Take the medicine your health care provider gives you as directed. Get plenty of sleep and rest. Exercise regularly. Eat a diet that contains calcium (good for the bones) and soy products (acts like estrogen hormone). Avoid alcoholic beverages. Do not smoke. If you have hot flashes, dress in layers. Take supplements, calcium, and vitamin D to strengthen bones. You can use over-the-counter lubricants or moisturizers for vaginal dryness. Group therapy is sometimes very helpful. Acupuncture may be helpful in some cases. SEEK MEDICAL CARE IF:  You are not sure you are in menopause. You are having menopausal symptoms and need advice and treatment. You are still having menstrual periods after age 41 years. You have pain with intercourse. Menopause is  complete (no menstrual period for 12 months) and you develop vaginal bleeding. You need a referral to a specialist (gynecologist, psychiatrist, or psychologist)  for treatment. SEEK IMMEDIATE MEDICAL CARE IF:  You have severe depression. You have excessive vaginal bleeding. You fell and think you have a broken bone. You have pain when you urinate. You develop leg or chest pain. You have a fast pounding heart beat (palpitations). You have severe headaches. You develop vision problems. You feel a lump in your breast. You have abdominal pain or severe indigestion. Document Released: 11/28/2003 Document Revised: 05/10/2013 Document Reviewed: 04/06/2013 [facility] Patient Information 2015 [place], [place]. This information is not intended to replace advice given to you by your health care provider. Make sure you discuss any questions you have with your health care provider. Uterine Fibroid A uterine fibroid is a growth (tumor) that occurs in your uterus. This type of tumor is not cancerous and does not spread out of the uterus. You can have one or many fibroids. Fibroids can vary in size, weight, and where they grow in the uterus. Some can become quite large. Most fibroids do not require medical treatment, but some can cause pain or heavy bleeding during and between periods. CAUSES  A fibroid is the result of a single uterine cell that keeps growing (unregulated), which is different than most cells in the human body. Most cells have a control mechanism that keeps them from reproducing without control.  SIGNS AND SYMPTOMS  Bleeding. Pelvic pain and pressure. Bladder problems due to the size of the fibroid. Infertility and miscarriages depending on the size and location of the fibroid. DIAGNOSIS  Uterine fibroids are diagnosed through a physical exam. Your health care provider may feel the lumpy tumors during a pelvic exam. Ultrasonography may be done to get information regarding size, location, and  number of tumors.  TREATMENT  Your health care provider may recommend watchful waiting. This involves getting the fibroid checked by your health care provider to see if it grows or shrinks.  Hormone treatment or an intrauterine device (IUD) may be prescribed.  Surgery may be needed to remove the fibroids (myomectomy) or the uterus (hysterectomy). This depends on your situation. When fibroids interfere with fertility and a woman wants to become pregnant, a health care provider may recommend having the fibroids removed.  HOME CARE INSTRUCTIONS  Home care depends on how you were treated. In general:  Keep all follow-up appointments with your health care provider.  Only take over-the-counter or prescription medicines as directed by your health care provider. If you were prescribed a hormone treatment, take the hormone medicines exactly as directed. Do not take aspirin. It can cause bleeding.  Talk to your health care provider about taking iron pills. If your periods are troublesome but not so heavy, lie down with your feet raised slightly above your heart. Place cold packs on your lower abdomen.  If your periods are heavy, write down the number of pads or tampons you use per month. Bring this information to your health care provider.  Include green vegetables in your diet.  SEEK IMMEDIATE MEDICAL CARE IF: You have pelvic pain or cramps not controlled with medicines.  You have a sudden increase in pelvic pain.  You have an increase in bleeding between and during periods.  You have excessive periods and soak tampons or pads in a half hour or less. You feel lightheaded or have fainting episodes. Document Released: 09/04/2000 Document Revised: 06/28/2013 Document Reviewed: 04/06/2013 [facility] Patient Information 2015 [place], [place]. This information is not intended to replace advice given to you by your health care provider. Make sure you discuss any  questions you have with your health care  provider.    An imbalance of the estrogen and progesterone hormones.  One of your ovaries not releasing an egg during one or more months.  Side effects of having an intrauterine device (IUD).  Side effects of some medicines, such as anti-inflammatory medicines or blood thinners.  A bleeding disorder that stops your blood from clotting normally. SIGNS AND SYMPTOMS  During a normal period, bleeding lasts between 4 and 8 days. Signs that your periods are too heavy include:  You routinely have to change your pad or tampon every 1 or 2 hours because it is completely soaked.  You pass blood clots larger than 1 inch (2.5 cm) in size.  You have bleeding for more than 7 days.  You need to use pads and tampons at the same time because of heavy bleeding.  You need to wake up to change your pads or tampons during the night.  You have symptoms of anemia, such as tiredness, fatigue, or shortness of breath. DIAGNOSIS  Your health care provider will perform a physical exam and ask you questions about your symptoms and menstrual history. Other tests may be ordered based on what the health care provider finds during the exam. These tests can include:  Blood tests. Blood tests are used to check if you are pregnant or have hormonal changes, a bleeding or thyroid disorder, low iron levels (anemia), or other problems.  Endometrial biopsy. Your health care provider takes a sample of tissue from the inside of your uterus to be examined under a microscope.  Pelvic ultrasound. This test uses sound waves to make a picture of your uterus, ovaries, and vagina. The pictures can show if you have fibroids or other growths.  Hysteroscopy. For this test, your health care provider will use a small telescope to look inside your uterus. Based on the results of your initial tests, your health care provider may recommend further testing. TREATMENT  Treatment may not be needed. If it is needed, your health care  provider may recommend treatment with one or more medicines first. If these do not reduce bleeding enough, a surgical treatment might be an option. The best treatment for you will depend on:   Whether you need to prevent pregnancy.  Your desire to have children in the future.  The cause and severity of your bleeding.  Your opinion and personal preference.  Medicines for menorrhagia may include:  Birth control methods that use hormones. These include the pill, skin patch, vaginal ring, shots that you get every 3 months, hormonal IUD, and implant. These treatments reduce bleeding during your menstrual period.  Medicines that thicken blood and slow bleeding.  Medicines that reduce swelling, such as ibuprofen.  Medicines that contain a synthetic hormone called progestin.   Medicines that make the ovaries stop working for a short time.  You may need surgical treatment for menorrhagia if the medicines are unsuccessful. Treatment options include:  Dilation and curettage (D&C). In this procedure, your health care provider opens (dilates) your cervix and then scrapes or suctions tissue from the lining of your uterus to reduce menstrual bleeding.  Operative hysteroscopy. This procedure uses a tiny tube with a light (hysteroscope) to view your uterine cavity and can help in the surgical removal of a polyp that may be causing heavy periods.  Endometrial ablation. Through various techniques, your health care provider permanently destroys the entire lining of your uterus (endometrium). After endometrial ablation, most women have little or  no menstrual flow. Endometrial ablation reduces your ability to become pregnant.  Endometrial resection. This surgical procedure uses an electrosurgical wire loop to remove the lining of the uterus. This procedure also reduces your ability to become pregnant.  Hysterectomy. Surgical removal of the uterus and cervix is a permanent procedure that stops menstrual  periods. Pregnancy is not possible after a hysterectomy. This procedure requires anesthesia and hospitalization. HOME CARE INSTRUCTIONS   Only take over-the-counter or prescription medicines as directed by your health care provider. Take prescribed medicines exactly as directed. Do not change or switch medicines without consulting your health care provider.  Take any prescribed iron pills exactly as directed by your health care provider. Long-term heavy bleeding may result in low iron levels. Iron pills help replace the iron your body lost from heavy bleeding. Iron may cause constipation. If this becomes a problem, increase the bran, fruits, and roughage in your diet.  Do not take aspirin or medicines that contain aspirin 1 week before or during your menstrual period. Aspirin may make the bleeding worse.  If you need to change your sanitary pad or tampon more than once every 2 hours, stay in bed and rest as much as possible until the bleeding stops.  Eat well-balanced meals. Eat foods high in iron. Examples are leafy green vegetables, meat, liver, eggs, and whole grain breads and cereals. Do not try to lose weight until the abnormal bleeding has stopped and your blood iron level is back to normal. SEEK MEDICAL CARE IF:   You soak through a pad or tampon every 1 or 2 hours, and this happens every time you have a period.  You need to use pads and tampons at the same time because you are bleeding so much.  You need to change your pad or tampon during the night.  You have a period that lasts for more than 8 days.  You pass clots bigger than 1 inch wide.  You have irregular periods that happen more or less often than once a month.  You feel dizzy or faint.  You feel very weak or tired.  You feel short of breath or feel your heart is beating too fast when you exercise.  You have nausea and vomiting or diarrhea while you are taking your medicine.  You have any problems that may be related  to the medicine you are taking. SEEK IMMEDIATE MEDICAL CARE IF:   You soak through 4 or more pads or tampons in 2 hours.  You have any bleeding while you are pregnant. MAKE SURE YOU:   Understand these instructions.  Will watch your condition.  Will get help right away if you are not doing well or get worse. Document Released: 09/07/2005 Document Revised: 09/12/2013 Document Reviewed: 02/26/2013 [facility] Patient Information 2015 [place], [place]. This information is not intended to replace advice given to you by your health care provider. Make sure you discuss any questions you have with your health care provider. Return in 1 week for [place] Physical in 1 year Mammogram yearly

## 2015-03-14 NOTE — Progress Notes (Signed)
Patient ID: Gabrielle Richards, female   DOB: 02-26-1971, 44 y.o.   MRN: [id number] History of Present Illness: Gabrielle Richards is a 44 year old black female in for well woman gyn exam,she had a normal pap with negative HPV 03/06/14.She is having heavy periods, will mess up clothes and bleeds through pads, and is skipping like every other period, she is sp ablation.She is having some hot flashes and night sweats.   Current Medications, Allergies, Past Medical History, Past Surgical History, Family History and Social History were reviewed in [employer] record.     Review of Systems: Patient denies any headaches, hearing loss, fatigue, blurred vision, shortness of breath, chest pain, abdominal pain, problems with bowel movements, urination, or intercourse. No joint pain or mood swings.See HPI for positives.    Physical Exam:BP 130/88 mmHg  Pulse 80  Ht 5\' 2"  (1.575 m)  Wt 236 lb 8 oz (107.276 kg)  BMI 43.25 kg/m2 General:  Well developed, well nourished, no acute distress Skin:  Warm and dry Neck:  Midline trachea, normal thyroid, good ROM, no lymphadenopathy Lungs; Clear to auscultation bilaterally Breast:  No dominant palpable mass, retraction, or nipple discharge Cardiovascular: Regular rate and rhythm Abdomen:  Soft, non tender, no hepatosplenomegaly Pelvic:  External genitalia is normal in appearance, no lesions.  The vagina is normal in appearance. Urethra has no lesions or masses. The cervix is bulbous.  Uterus is felt to be enlarged with fibroid felt, but difficult secondary to abdominal girth.  No adnexal masses or tenderness noted.Bladder is non tender, no masses felt. Rectal: Good sphincter tone, no polyps, or hemorrhoids felt.  Hemoccult negative. Extremities/musculoskeletal:  No swelling or varicosities noted, no clubbing or cyanosis Psych:  No mood changes, alert and cooperative,seems happy Discussed could be fibroid or menopause, will get [place] and check labs, and  then talk options.  Impression: Well woman gyn exam no pap Menorrhagia with irregular cycle    Plan: Check CBC,CMP,TSH and FSH Return in 1 week for gyn [place] Physical in 1 year Mammogram yearly Colonoscopy at 48 Review handout on menorrhagia,menopause and fibroids

## 2015-03-15 ENCOUNTER — Other Ambulatory Visit: Payer: 59 | Admitting: Adult Health

## 2015-03-15 ENCOUNTER — Telehealth: Payer: Self-pay | Admitting: Adult Health

## 2015-03-15 LAB — COMPREHENSIVE METABOLIC PANEL
ALT: 9 IU/L (ref 0–32)
AST: 15 IU/L (ref 0–40)
Albumin/Globulin Ratio: 1.3 (ref 1.1–2.5)
Albumin: 4.1 g/dL (ref 3.5–5.5)
Alkaline Phosphatase: 62 IU/L (ref 39–117)
BUN/Creatinine Ratio: 10 (ref 9–23)
BUN: 10 mg/dL (ref 6–24)
Bilirubin Total: 0.2 mg/dL (ref 0.0–1.2)
CALCIUM: 10.3 mg/dL — AB (ref 8.7–10.2)
CHLORIDE: 97 mmol/L (ref 97–108)
CO2: 23 mmol/L (ref 18–29)
Creatinine, Ser: 1 mg/dL (ref 0.57–1.00)
GFR, EST AFRICAN AMERICAN: 78 mL/min/{1.73_m2} (ref >59)
GFR, EST NON AFRICAN AMERICAN: 68 mL/min/{1.73_m2} (ref >59)
Globulin, Total: 3.1 g/dL (ref 1.5–4.5)
Glucose: 75 mg/dL (ref 65–99)
POTASSIUM: 4.3 mmol/L (ref 3.5–5.2)
SODIUM: 138 mmol/L (ref 134–144)
Total Protein: 7.2 g/dL (ref 6.0–8.5)

## 2015-03-15 LAB — CBC
Hematocrit: 39.8 % (ref 34.0–46.6)
Hemoglobin: 13.2 g/dL (ref 11.1–15.9)
MCH: 29.5 pg (ref 26.6–33.0)
MCHC: 33.2 g/dL (ref 31.5–35.7)
MCV: 89 fL (ref 79–97)
PLATELETS: 318 10*3/uL (ref 150–379)
RBC: 4.48 x10E6/uL (ref 3.77–5.28)
RDW: 13.6 % (ref 12.3–15.4)
WBC: 7.8 10*3/uL (ref 3.4–10.8)

## 2015-03-15 LAB — TSH: TSH: 1.86 u[IU]/mL (ref 0.450–4.500)

## 2015-03-15 LAB — FOLLICLE STIMULATING HORMONE: FSH: 11.8 m[IU]/mL

## 2015-03-15 NOTE — Telephone Encounter (Signed)
Left message labs normal

## 2015-03-18 ENCOUNTER — Telehealth: Payer: Self-pay | Admitting: *Deleted

## 2015-03-18 NOTE — Telephone Encounter (Signed)
Noted, agree with discontinuing, though pt has been on for 6 weeks

## 2015-03-18 NOTE — Telephone Encounter (Signed)
Received call from patient.   Reports that she has stopped Contrave D/T reaction. States that she noted she was having hives with medication.   She denies any difficulty breathing, but reports that areas have improved since she stopped medication.   MD to be made aware.

## 2015-03-20 ENCOUNTER — Other Ambulatory Visit: Payer: 59

## 2015-03-27 ENCOUNTER — Ambulatory Visit (INDEPENDENT_AMBULATORY_CARE_PROVIDER_SITE_OTHER): Payer: 59

## 2015-03-27 DIAGNOSIS — N921 Excessive and frequent menstruation with irregular cycle: Secondary | ICD-10-CM

## 2015-03-27 NOTE — Progress Notes (Signed)
US PELVIS: heterogenous anteverted uterus,EEC  6.261mm,single thin septated rt ov cyst 3.8 x 3.4 x 2.1cm,normal lt ov,no free fluid seen,no pain during US,ov's appear to be mobile

## 2015-03-29 ENCOUNTER — Telehealth: Payer: Self-pay | Admitting: Adult Health

## 2015-03-29 MED ORDER — MEGESTROL ACETATE 40 MG PO TABS: 40.0000 mg | ORAL_TABLET | Freq: Every day | ORAL | Status: DC

## 2015-03-29 NOTE — Telephone Encounter (Signed)
Pt aware US just showed simple cyst right ovary, no fibroids and FSH 11.8, will give megace 40 mg daily to see if helps with bleeding, and await US review by MD

## 2015-03-29 NOTE — Telephone Encounter (Signed)
Left message to call me.

## 2015-04-04 ENCOUNTER — Encounter: Payer: Self-pay | Admitting: Dietician

## 2015-04-04 ENCOUNTER — Encounter: Payer: 59 | Attending: Family Medicine | Admitting: Dietician

## 2015-04-04 DIAGNOSIS — Z713 Dietary counseling and surveillance: Secondary | ICD-10-CM | POA: Diagnosis not present

## 2015-04-04 DIAGNOSIS — Z6841 Body Mass Index (BMI) 40.0 and over, adult: Secondary | ICD-10-CM | POA: Insufficient documentation

## 2015-04-04 NOTE — Progress Notes (Signed)
  Medical Nutrition Therapy:  Appt start time: 240 end time:  340   Assessment:  Primary concerns today: Gabrielle SalvageJuliette is here today referred for obesity. She has tried Belviq and did not lose much weight (15 lbs). Has also tried Contrave but had an allergic reaction. She states she could tell that the medication helped her control her portion sizes. She states she prefers to exercise outside rather that indoors. She wakes up at 5am and works from 8 to 5pm, sometimes much later. Her bedtime varies, but she tries to go to sleep by 11pm. She has cut back her hours at her 2nd job because she was feeling fatigued. She lives alone, and has 2 adult children.   Preferred Learning Style:   No preference indicated   Learning Readiness:   Ready   MEDICATIONS: see list   DIETARY INTAKE:  24-hr recall:  Sweet tea on the way to work B ( AM): boiled egg, 1-2 strips bacon, and toast from the cafeteria   Snk ( AM): candy or chips L ( PM):  chicken wings or hot dogs or salad  Snk ( PM): candy or chips D ( PM): fast food and Cong[place]inese food Snk ( PM): candy or chips   Beverages: sodas, sweet tea, lemonade, some water  Usual physical activity: walking in her neighborhood  Estimated energy needs: 1600-1800 calories 180-200 g carbohydrates 120-135 g protein 44-50 g fat  Progress Towards Goal(s):  In progress.   Nutritional Diagnosis:  [place]-3.3 Overweight/obesity As related to inappropriate food choices and excessive energy intake.  As evidenced by BMI 43.6.    Intervention:  Nutrition counseling provided. Goals: -Have Lean Cuisine or Healthy Choice meals instead of fast food for lunch or dinner -Try steamer bags of veggies -Keep these on hand at work and at home -Fill up on non starchy vegetables (any veggie except corn, peas, or potatoes)  -Veggies are great raw or cooked, fresh or frozen! -Stash healthy snacks at work  -PepsiCoCottage cheese, fresh fruit, raw veggies, low fat cheese, Greek yogurt,  [place] [employer] protein bar, boiled eggs -Continue walking routine -Work on drinking more water (work on cutting down on sugary drinks)  -Try mixing juice with sparkling water or seltzer  -Try Diet cranberry juice  -Try drinking a big cup of water before having tea or soda  Teaching Method Utilized:  Visual Auditory Hands on  Handouts given during visit include:  Snack list  MyPlate  Low sodium flavoring tips  Barriers to learning/adherence to lifestyle change: none  Demonstrated degree of understanding via:  Teach Back   Monitoring/Evaluation:  Dietary intake, exercise, and body weight in 3 month(s).

## 2015-04-04 NOTE — Patient Instructions (Addendum)
-  Have Lean Cuisine or Healthy Choice meals instead of fast food for lunch or dinner -Try steamer bags of veggies -Keep these on hand at work and at home  -Fill up on non starchy vegetables (any veggie except corn, peas, or potatoes)  -Veggies are great raw or cooked, fresh or frozen!  -Stash healthy snacks at work  -Pepsi[employer]Cottage cheese, fresh fruit, raw veggies, low fat cheese, Greek yogurt, [place] [employer] protein bar, boiled eggs  -Continue walking routine  -Work on drinking more water (work on cutting down on sugary drinks)  -Try mixing juice with sparkling water or seltzer  -Try Diet cranberry juice  -Try drinking a big cup of water before having tea or soda

## 2015-05-03 ENCOUNTER — Ambulatory Visit: Payer: 59 | Admitting: Family Medicine

## 2015-05-08 ENCOUNTER — Ambulatory Visit: Payer: 59 | Admitting: Family Medicine

## 2015-05-15 ENCOUNTER — Ambulatory Visit (INDEPENDENT_AMBULATORY_CARE_PROVIDER_SITE_OTHER): Payer: 59 | Admitting: Family Medicine

## 2015-05-15 ENCOUNTER — Encounter: Payer: Self-pay | Admitting: Family Medicine

## 2015-05-15 DIAGNOSIS — R609 Edema, unspecified: Secondary | ICD-10-CM | POA: Diagnosis not present

## 2015-05-15 MED ORDER — PHENTERMINE HCL 37.5 MG PO TABS: 37.5000 mg | ORAL_TABLET | Freq: Every day | ORAL | Status: DC

## 2015-05-16 ENCOUNTER — Encounter: Payer: Self-pay | Admitting: Family Medicine

## 2015-05-16 MED ORDER — HYDROCHLOROTHIAZIDE 25 MG PO TABS: 25.0000 mg | ORAL_TABLET | Freq: Every day | ORAL | Status: DC

## 2015-05-16 NOTE — Assessment & Plan Note (Signed)
Change HCTZ to , decrease sodium

## 2015-05-16 NOTE — Progress Notes (Signed)
Patient ID: Gabrielle Richards, female   DOB: [birth year], 44 y.o.   MRN: [id number]   Subjective:    Patient ID: Gabrielle Richards, female    DOB: November 06, 1970, 44 y.o.   MRN: [id number]  Patient presents for F/U  Pt here to f/u weight, stopped contrave as 4 weeks in, she broke out in hives. Her ex husband passed away in 16-Apr-2023 and this has been very difficult for her children and herself she has had a lot of emotional and stress eating. She did see the nutritionist but has not put into play any of the healthy eating or meals plans. Today she wants to try to get back on track and see if there was any other options. Note she lost 4 lbs in the first month on Contrave based on a GYN visit she had   She also notes she has had more swelling around her ankles the HCTZ works only a little, denies CP,SOBB   Review Of Systems:  GEN- denies fatigue, fever, weight loss,weakness, recent illness HEENT- denies eye drainage, change in vision, nasal discharge, CVS- denies chest pain, palpitations RESP- denies SOB, cough, wheeze ABD- denies N/V, change in stools, abd pain GU- denies dysuria, hematuria, dribbling, incontinence MSK- denies joint pain, muscle aches, injury Neuro- denies headache, dizziness, syncope, seizure activity       Objective:    BP 128/62 mmHg  Pulse 78  Temp(Src) 98.7 F (37.1 C) (Oral)  Resp 14  Ht  (1.575 m)  Wt 240 lb (108.863 kg)  BMI 43.89 kg/m2 GEN- NAD, alert and oriented x3,obese HEENT- PERRL, EOMI, non injected sclera, pink conjunctiva, MMM, oropharynx clear Neck- Supple, no thyromegaly, no JVD CVS- RRR, no murmur RESP-CTAB ABD-NABS,soft,NT,ND Psych- normal affect and mood EXT- trace pedal edema Pulses- Radial 2+        Assessment & Plan:      Problem List Items Addressed This Visit    None      Note: This dictation was prepared with Dragon dictation along with smaller phrase technology. Any transcriptional errors that result from this process are  unintentional.

## 2015-05-16 NOTE — Assessment & Plan Note (Signed)
Trial of phentermine, she is at risk of CAD, CVA , DM if she does not improve her weight and healthy lifestyle Discussed not using excuses not to work out or eat healthy. Will start with phentermine 1/2 tab and titrate up Continue MVI Walk at least 3 days a week F/U with nutritiomist as scheduled next month

## 2015-07-08 ENCOUNTER — Ambulatory Visit: Payer: 59 | Admitting: Dietician

## 2015-09-25 ENCOUNTER — Other Ambulatory Visit: Payer: 59

## 2015-09-27 ENCOUNTER — Encounter: Payer: 59 | Admitting: Family Medicine

## 2016-02-21 ENCOUNTER — Telehealth: Payer: Self-pay | Admitting: Family Medicine

## 2016-02-21 MED ORDER — HYDROCHLOROTHIAZIDE 25 MG PO TABS: 25.0000 mg | ORAL_TABLET | Freq: Every day | ORAL | Status: DC

## 2016-02-21 NOTE — Telephone Encounter (Signed)
Refill appropriate and filled per protocol. 

## 2016-02-21 NOTE — Telephone Encounter (Signed)
Pt is calling for a refill of HCTZ. She has scheduled an appt for [facility] [place]

## 2016-02-25 ENCOUNTER — Other Ambulatory Visit: Payer: Self-pay | Admitting: Adult Health

## 2016-02-25 DIAGNOSIS — Z1231 Encounter for screening mammogram for malignant neoplasm of breast: Secondary | ICD-10-CM

## 2016-02-26 ENCOUNTER — Other Ambulatory Visit: Payer: Self-pay | Admitting: Adult Health

## 2016-02-26 DIAGNOSIS — R921 Mammographic calcification found on diagnostic imaging of breast: Secondary | ICD-10-CM

## 2016-03-03 ENCOUNTER — Other Ambulatory Visit: Payer: Self-pay | Admitting: Adult Health

## 2016-03-03 DIAGNOSIS — R921 Mammographic calcification found on diagnostic imaging of breast: Secondary | ICD-10-CM

## 2016-03-05 ENCOUNTER — Ambulatory Visit (HOSPITAL_COMMUNITY): Payer: 59

## 2016-03-09 ENCOUNTER — Other Ambulatory Visit: Payer: Self-pay | Admitting: Obstetrics & Gynecology

## 2016-03-09 DIAGNOSIS — H10502 Unspecified blepharoconjunctivitis, left eye: Secondary | ICD-10-CM | POA: Diagnosis not present

## 2016-03-09 DIAGNOSIS — R921 Mammographic calcification found on diagnostic imaging of breast: Secondary | ICD-10-CM

## 2016-03-10 ENCOUNTER — Ambulatory Visit (HOSPITAL_COMMUNITY)
Admission: RE | Admit: 2016-03-10 | Discharge: 2016-03-10 | Disposition: A | Discharge status: Home / Self Care | Payer: 59 | Source: Ambulatory Visit | Attending: Adult Health | Admitting: Adult Health

## 2016-03-10 DIAGNOSIS — R921 Mammographic calcification found on diagnostic imaging of breast: Secondary | ICD-10-CM | POA: Insufficient documentation

## 2016-03-17 ENCOUNTER — Ambulatory Visit (INDEPENDENT_AMBULATORY_CARE_PROVIDER_SITE_OTHER): Payer: 59 | Admitting: Family Medicine

## 2016-03-17 ENCOUNTER — Encounter: Payer: Self-pay | Admitting: Family Medicine

## 2016-03-17 VITALS — BP 126/70 | HR 76 | Temp 98.5°F | Resp 14 | Ht 62.0 in | Wt 257.0 lb

## 2016-03-17 DIAGNOSIS — Z0000 Encounter for general adult medical examination without abnormal findings: Secondary | ICD-10-CM | POA: Diagnosis not present

## 2016-03-17 DIAGNOSIS — E785 Hyperlipidemia, unspecified: Secondary | ICD-10-CM | POA: Diagnosis not present

## 2016-03-17 DIAGNOSIS — Z1321 Encounter for screening for nutritional disorder: Secondary | ICD-10-CM

## 2016-03-17 MED ORDER — PHENTERMINE HCL 37.5 MG PO TABS: 37.5000 mg | ORAL_TABLET | Freq: Every day | ORAL | Status: DC

## 2016-03-17 MED ORDER — HYDROCHLOROTHIAZIDE 25 MG PO TABS: 25.0000 mg | ORAL_TABLET | Freq: Every day | ORAL | Status: DC

## 2016-03-17 MED FILL — PHENTERMINE 37.5 MG TABLET: 37.5 | 30 days supply | Qty: 30 | Fill #0

## 2016-03-17 MED FILL — HYDROCHLOROTHIAZIDE 25 MG T: 25 | 30 days supply | Qty: 30 | Fill #0

## 2016-03-17 NOTE — Progress Notes (Signed)
Patient ID: Gabrielle Richards, female   DOB: August 23, 1971, 45 y.o.   MRN: [id number]   Subjective:    Patient ID: Gabrielle Richards, female    DOB: Apr 29, 1971, 45 y.o.   MRN: [id number]  Patient presents for CPE  Mammogram- UTD PAP - followed by GYN Medications reviewed -  Obesity- last visit in June 2016- 240lbs given phentermine but she lost the script, had SE with contrave . Also given HCTZ last visit due to leg swelling  She is actually gained 17 pounds since her last visit admits to not eating proper nutrition she's not been exercising. She is now getting back on track she has met with the wellness provider for her work and had one training session.  Immunizations UTD    Review Of Systems:  GEN- denies fatigue, fever, weight loss,weakness, recent illness HEENT- denies eye drainage, change in vision, nasal discharge, CVS- denies chest pain, palpitations RESP- denies SOB, cough, wheeze ABD- denies N/V, change in stools, abd pain GU- denies dysuria, hematuria, dribbling, incontinence MSK- denies joint pain, muscle aches, injury Neuro- denies headache, dizziness, syncope, seizure activity       Objective:    BP 126/70 mmHg  Pulse 76  Temp(Src) 98.5 F (36.9 C) (Oral)  Resp 14  Ht 5' 2"  (1.575 m)  Wt 257 lb (116.574 kg)  BMI 46.99 kg/m2  LMP 03/03/2016 GEN- NAD, alert and oriented x3 HEENT- PERRL, EOMI, non injected sclera, pink conjunctiva, MMM, oropharynx clear Neck- Supple, no thyromegaly CVS- RRR, no murmur RESP-CTAB ABD-NABS,soft,NT,ND EXT- No edema Pulses- Radial, DP- 2+        Assessment & Plan:      Problem List Items Addressed This Visit    Routine general medical examination at a health care facility    CPE done, immunizations GYN utd, major problem is weight, hyperlipidemia. Plan to try phentermine, referral to nutrionist to help with meal planning calorie counting.  She is putting herself at risk for CAD, DM down the road      Relevant Orders   CBC  with Differential/Platelet   Comprehensive metabolic panel   Lipid panel   TSH   Morbid obesity ([place]) - Primary   Relevant Medications   phentermine (ADIPEX-P) 37.5 MG tablet   Other Relevant Orders   Ambulatory referral to diabetic education   Hyperlipidemia   Relevant Medications   hydrochlorothiazide (HYDRODIURIL) 25 MG tablet    Other Visit Diagnoses    Encounter for vitamin deficiency screening        Relevant Orders    Vitamin D, 25-hydroxy       Note: This dictation was prepared with Dragon dictation along with smaller phrase technology. Any transcriptional errors that result from this process are unintentional.

## 2016-03-17 NOTE — Assessment & Plan Note (Signed)
CPE done, immunizations GYN utd, major problem is weight, hyperlipidemia. Plan to try phentermine, referral to nutrionist to help with meal planning calorie counting.  She is putting herself at risk for CAD, DM down the road

## 2016-03-17 NOTE — Patient Instructions (Signed)
I recommend eye visit once a year I recommend dental visit every 6 months Goal is to  Exercise 30 minutes 5 days a week We will send a letter with lab results  F/U 2 months

## 2016-03-18 LAB — COMPREHENSIVE METABOLIC PANEL
ALBUMIN: 3.9 g/dL (ref 3.6–5.1)
ALT: 8 U/L (ref 6–29)
AST: 16 U/L (ref 10–35)
Alkaline Phosphatase: 57 U/L (ref 33–115)
BILIRUBIN TOTAL: 0.4 mg/dL (ref 0.2–1.2)
BUN: 12 mg/dL (ref 7–25)
CALCIUM: 10.1 mg/dL (ref 8.6–10.2)
CO2: 27 mmol/L (ref 20–31)
Chloride: 100 mmol/L (ref 98–110)
Creat: 0.83 mg/dL (ref 0.50–1.10)
Glucose, Bld: 77 mg/dL (ref 70–99)
POTASSIUM: 3.9 mmol/L (ref 3.5–5.3)
Sodium: 138 mmol/L (ref 135–146)
Total Protein: 7 g/dL (ref 6.1–8.1)

## 2016-03-18 LAB — CBC WITH DIFFERENTIAL/PLATELET
Basophils Absolute: 76 cells/uL (ref 0–200)
Basophils Relative: 1 %
EOS ABS: 152 {cells}/uL (ref 15–500)
Eosinophils Relative: 2 %
HEMATOCRIT: 40.3 % (ref 35.0–45.0)
Hemoglobin: 13.2 g/dL (ref 12.0–15.0)
LYMPHS ABS: 2432 {cells}/uL (ref 850–3900)
LYMPHS PCT: 32 %
MCH: 29.5 pg (ref 27.0–33.0)
MCHC: 32.8 g/dL (ref 32.0–36.0)
MCV: 90 fL (ref 80.0–100.0)
MPV: 9.9 fL (ref 7.5–12.5)
Monocytes Absolute: 760 cells/uL (ref 200–950)
Monocytes Relative: 10 %
NEUTROS PCT: 55 %
Neutro Abs: 4180 cells/uL (ref 1500–7800)
Platelets: 327 10*3/uL (ref 140–400)
RBC: 4.48 MIL/uL (ref 3.80–5.10)
RDW: 14.1 % (ref 11.0–15.0)
WBC: 7.6 10*3/uL (ref 3.8–10.8)

## 2016-03-18 LAB — LIPID PANEL
CHOL/HDL RATIO: 3.6 ratio (ref <=5.0)
Cholesterol: 243 mg/dL — ABNORMAL HIGH (ref 125–200)
HDL: 67 mg/dL (ref >=46)
LDL CALC: 157 mg/dL — AB (ref <130)
TRIGLYCERIDES: 94 mg/dL (ref <150)
VLDL: 19 mg/dL (ref <30)

## 2016-03-18 LAB — TSH: TSH: 1.05 mIU/L

## 2016-03-18 LAB — VITAMIN D 25 HYDROXY (VIT D DEFICIENCY, FRACTURES): Vit D, 25-Hydroxy: 33 ng/mL (ref 30–100)

## 2016-03-23 ENCOUNTER — Encounter: Payer: Self-pay | Admitting: *Deleted

## 2016-03-24 ENCOUNTER — Encounter: Payer: Self-pay | Admitting: Family Medicine

## 2016-03-29 DIAGNOSIS — B349 Viral infection, unspecified: Secondary | ICD-10-CM | POA: Diagnosis not present

## 2016-03-29 DIAGNOSIS — R062 Wheezing: Secondary | ICD-10-CM | POA: Diagnosis not present

## 2016-04-06 ENCOUNTER — Encounter: Payer: 59 | Attending: Family Medicine | Admitting: Nutrition

## 2016-04-06 ENCOUNTER — Encounter: Payer: Self-pay | Admitting: Nutrition

## 2016-04-06 DIAGNOSIS — E785 Hyperlipidemia, unspecified: Secondary | ICD-10-CM

## 2016-04-06 DIAGNOSIS — Z713 Dietary counseling and surveillance: Secondary | ICD-10-CM | POA: Insufficient documentation

## 2016-04-06 DIAGNOSIS — I10 Essential (primary) hypertension: Secondary | ICD-10-CM

## 2016-04-06 NOTE — Patient Instructions (Signed)
Goals 1.Follow My Plate  2. Exercise 30 minutes once a week. 3.  Cut out sodas and juice 4. Drink 4 bottles of water per day. 5. Eat three meals per day at times as discussed. 6. Lose 1 lb per week.

## 2016-04-06 NOTE — Progress Notes (Signed)
  Medical Nutrition Therapy:  Appt start time: 0800 end time:  0900.  Assessment:  Primary concerns today: Overweight. Wants to lose weight. Lost her husband 1 year ago and has had a stressful year. Has 2 kids' one n college. 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 Has Hyperlipidemia and HTN. Not prediabetic but at risk for DM. TCHOl 243 md/dl, LDL 324157 mg/dl. Family history of DM, Heart Disease and HTN.. Wants to lose down to 200 lbs.  Gained 17 lbs in the last year.  Physcial actvity:Not a lot outside of work. Works full time.  Eats 2-3 meals per day. Eats most meals away from home. Doesn't cook at all. Willing to work on improving foods choice and exercise.  Diet exceeds current nutritional needs and low in fresh fruits, vegetables and whole grains. Needs to drink more water and cut out sodas .  Preferred Learning Style:     No preference indicated    Learning Readiness     Ready  Change in progress   MEDICATIONS: See list   DIETARY INTAKE:    24-hr recall:  B ( AM): Oatmeal or boiled eggs, sausage and gravy and fruit,  Juice or soda  OR sugar pops  1 cup with  Whole milk  Snk ( AM): none  L ( PM): skipped:  Snk ( PM): none D ( PM): Meatloaf, corn, mashed potaotes, gravy and roll, coke Snk ( PM): none Beverages: soda, juice, water (1 cup)  Usual physical activity: walks some  Estimated energy needs: 1500 calories 170 g carbohydrates 112 g protein 42 g fat  Progress Towards Goal(s):  In progress.   Nutritional Diagnosis:  [place]-1.1 Food and nutrition-related knowledge deficit As related to Obesity.  As evidenced by BMI > 40.    Intervention:  My Plate, portion sizes, heart healthy low fat low sodium high fiber diet, meal planning, cutting out processed and sugar foods, benefits of exercise and focusing on quality of foods for health, increasing fresh fruits and vegetables and whole grains, timing of meals and avoiding snacks between  meals.   Goals 1.Follow My Plate  2. Exercise 30 minutes once a week. 3.  Cut out sodas and juice 4. Drink 4 bottles of water per day. 5. Eat three meals per day at times as discussed. 6. Lose 1 lb per week.  Teaching Method Utilized:   Visual Auditory Hands on  Handouts given during visit include:  The Plate Method  Meal Plan Card  Barriers to learning/adherence to lifestyle change: None  Demonstrated degree of understanding via:  Teach Back   Monitoring/Evaluation:  Dietary intake, exercise, meal planning,, and body weight in 1-2  month(s).

## 2016-04-20 MED FILL — HYDROCHLOROTHIAZIDE 25 MG T: 25 | 30 days supply | Qty: 30 | Fill #1

## 2016-04-20 MED FILL — PHENTERMINE 37.5 MG TABLET: 37.5 | 30 days supply | Qty: 30 | Fill #1

## 2016-05-18 MED FILL — PHENTERMINE 37.5 MG TABLET: 37.5 | 30 days supply | Qty: 30 | Fill #2

## 2016-05-18 MED FILL — HYDROCHLOROTHIAZIDE 25 MG T: 25 | 90 days supply | Qty: 90 | Fill #2

## 2016-05-22 ENCOUNTER — Ambulatory Visit: Payer: 59 | Admitting: Nutrition

## 2016-05-26 ENCOUNTER — Encounter: Payer: Self-pay | Admitting: Family Medicine

## 2016-05-26 ENCOUNTER — Ambulatory Visit (INDEPENDENT_AMBULATORY_CARE_PROVIDER_SITE_OTHER): Payer: 59 | Admitting: Family Medicine

## 2016-05-26 DIAGNOSIS — E785 Hyperlipidemia, unspecified: Secondary | ICD-10-CM | POA: Diagnosis not present

## 2016-05-26 NOTE — Assessment & Plan Note (Signed)
She is morbidly obese with very little motivation. She seems to get very frustrated easily and gets up. We discussed making slow simple changes to get her going. I think is a good idea that she will have the fitness coach at the hospital she is very excited about this. With regards to her diet I do not see any reason to continue with the phentermine at this time. We will discontinue. She needs to work on lowering her carbs increasing her protein and healthy fats to keep her from snacking throughout the day. We also plan to get her drinking at least 4 cups of water a day. She is going to reschedule with the nutritionist as well. Since way makes her anxious she will not do this at home instead we discussed taking pictures were using measurements to help with her weight.

## 2016-05-26 NOTE — Assessment & Plan Note (Signed)
Mild hyperlipidemia in setting of obesity, plan to recheck at next visit

## 2016-05-26 NOTE — Patient Instructions (Addendum)
Plan for exercise- Zumba twice a week and walk 1 day Meet with trainer Diet: 4 cups of water   Lower the carbs, more healthy fats- nut butters, nuts- mixed, fruit/veggies   Cut down the fastfood  ESTROVEN VItamin No more than 2000IU of Vitamin D needed  Stop Phentermine  F/U 3 months

## 2016-05-26 NOTE — Progress Notes (Signed)
   Subjective:    Patient ID: Gabrielle Richards, female    DOB: 10/12/1970, 45 y.o.   MRN: [id number]  Patient presents for Follow-up (is not fasting)  Patient here to follow chronic medical problems. At her last visit we started phentermine for weight loss. Her weight was 257 pounds today she is 253 pounds. She was  also referred to a nutritionist she had one visit but has not adopted any of the dietary changes. She has noticed increased sweats with phentermine, but in general thinks her menopause is why she can't lose weight. On further discussion she still snacking throughout the day mostly practicing cookies in addition to her 3 meals a day. She eats out a lot as well. She finds it difficult to prepare meals just for 1. She now has a new plan for her exercise. She is planning to start Zumba twice a week and walk once a week, currently walking once a week, also [facility]  She is trying to increase water   Review Of Systems:  GEN- denies fatigue, fever, weight loss,weakness, recent illness HEENT- denies eye drainage, change in vision, nasal discharge, CVS- denies chest pain, palpitations RESP- denies SOB, cough, wheeze ABD- denies N/V, change in stools, abd pain GU- denies dysuria, hematuria, dribbling, incontinence MSK- denies joint pain, muscle aches, injury Neuro- denies headache, dizziness, syncope, seizure activity       Objective:    BP 130/74 (BP Location: Left Arm, Patient Position: Sitting, Cuff Size: Large)   Pulse 78   Temp 98.2 F (36.8 C) (Oral)   Resp 14   Ht 5\' 2"  (1.575 m)   Wt 253 lb (114.8 kg)   BMI 46.27 kg/m  GEN- NAD, alert and oriented x3y CVS- RRR, no murmur RESP-CTAB EXT- No edema Pulses- Radial 2+        Assessment & Plan:      Problem List Items Addressed This Visit    Morbid obesity ([facility]) - Primary    She is morbidly obese with very little motivation. She seems to get very frustrated easily and  gets up. We discussed making slow simple changes to get her going. I think is a good idea that she will have the fitness coach at the hospital she is very excited about this. With regards to her diet I do not see any reason to continue with the phentermine at this time. We will discontinue. She needs to work on lowering her carbs increasing her protein and healthy fats to keep her from snacking throughout the day. We also plan to get her drinking at least 4 cups of water a day. She is going to reschedule with the nutritionist as well. Since way makes her anxious she will not do this at home instead we discussed taking pictures were using measurements to help with her weight.      Hyperlipidemia    Mild hyperlipidemia in setting of obesity, plan to recheck at next visit       Other Visit Diagnoses   None.     Note: This dictation was prepared with Dragon dictation along with smaller phrase technology. Any transcriptional errors that result from this process are unintentional.

## 2016-08-19 MED FILL — HYDROCHLOROTHIAZIDE 25 MG T: 25 | 60 days supply | Qty: 60 | Fill #3

## 2016-08-25 ENCOUNTER — Ambulatory Visit: Payer: 59 | Admitting: Family Medicine

## 2016-09-25 ENCOUNTER — Encounter: Payer: Self-pay | Admitting: Family Medicine

## 2016-09-25 ENCOUNTER — Telehealth: Payer: Self-pay | Admitting: Family Medicine

## 2016-09-25 ENCOUNTER — Ambulatory Visit (INDEPENDENT_AMBULATORY_CARE_PROVIDER_SITE_OTHER): Payer: 59 | Admitting: Family Medicine

## 2016-09-25 DIAGNOSIS — E7800 Pure hypercholesterolemia, unspecified: Secondary | ICD-10-CM

## 2016-09-25 MED ORDER — LORCASERIN HCL 10 MG PO TABS: ORAL_TABLET | ORAL | 2 refills | Status: DC

## 2016-09-25 NOTE — Telephone Encounter (Signed)
Patient was prescribed Belviq this morning by Dr. Brett CanalesSteve.  She says the coupons we gave her had expired, but the pharmacy checked with the company and got the correct information and were able to help her.  She said the pharmacy resent us information and she wanted to let Dr. Brett CanalesSteve know this just in case he wanted to know why they resent something to us.

## 2016-09-25 NOTE — Progress Notes (Signed)
   Subjective:    Patient ID: Gabrielle Richards, female    DOB: 09/28/1970, 46 y.o.   MRN: [id number]  HPI Patient is here today to establish care as a new patient. Patient wants to discuss weight loss options also. Patient has no other concerns at this time.   Both sides of the family tend to obediyy  Was exercising some in the past, a lot of walking, sig mileage  Some zumba  Son and daught er  Two kids at home [age] and [age] two, ond in col one working   Dietician disc wight loss meds, but also pt did not tke up   [place] phen umder a month Then stopped it because she is worried about potential side effects  Has taken Bell V can the past stated that it definitely helped. Was frustrated when her prior family physician stopped it.  Admits to poor exercise at this time. Next  Admits to minimal dietary control at this time. Next  Starting to feel more short of breath with exertion. Notes more generalized fatigue at the end of a long day   Review of Systems No headache, no major weight loss or weight gain, no chest pain no back pain abdominal pain no change in bowel habits complete ROS otherwise negative      Objective:   Physical Exam Alert vitals stable, NAD. Blood pressure good on repeat. HEENT normal. Lungs clear. Heart regular rate and rhythm.        Assessment & Plan:  Impression 1 morbid obesity long discussion held regarding the nature of the diagnosis. Patient motivated this time to get on with. Recommend she gets back with her nutritionist. Exercise strongly encouraged. At least 4 days per week 2 and half hours. Sustained substantial effort. Represcribed Belfi, cost-benefit side effects all discussed at length. 25 minutes spent most in discussion. Recheck in several months. Patient's elevated cholesterols discussed

## 2016-09-25 NOTE — Telephone Encounter (Signed)
ok 

## 2016-10-06 ENCOUNTER — Telehealth: Payer: Self-pay | Admitting: Family Medicine

## 2016-10-06 NOTE — Telephone Encounter (Signed)
Left message to return call 

## 2016-10-06 NOTE — Telephone Encounter (Signed)
Patient [facility] Rx for [facility].  She said [facility] is waiting on our authorization to release this medication.

## 2016-10-06 NOTE — Telephone Encounter (Signed)
Medication needs prior authorization thru insurance and form has been submitted to insurance and we are waiting for approval or denial of medication.

## 2016-10-06 NOTE — Telephone Encounter (Signed)
Patient advised Medication needs prior authorization thru insurance and form has been submitted to insurance and we are waiting for approval or denial of medication. Patient verbalized understanding

## 2016-10-13 ENCOUNTER — Telehealth: Payer: Self-pay | Admitting: Pediatrics

## 2016-10-13 NOTE — Telephone Encounter (Signed)
PA for Belviq was denied.  Denial letter on your desk. Would you like to change medication or appeal?

## 2016-10-21 ENCOUNTER — Other Ambulatory Visit: Payer: Self-pay | Admitting: Family Medicine

## 2016-10-21 MED FILL — HYDROCHLOROTHIAZIDE 25 MG T: 25 | 30 days supply | Qty: 30 | Fill #0

## 2016-10-26 NOTE — Telephone Encounter (Signed)
Lets try to appeal, I know carolyn has a lot of these and maybe you'all will e successful

## 2016-10-29 ENCOUNTER — Telehealth: Payer: Self-pay | Admitting: *Deleted

## 2016-10-29 NOTE — Telephone Encounter (Signed)
Left message to return call. Insurance will not pay for BelVIQ. Pt can pay out of pocket if she wants meds. Prior authorization determination letter scanned into pt's chart.

## 2016-10-29 NOTE — Telephone Encounter (Signed)
Spoke with patient and informed her that insurance will not pay for Belviq. May pay out of pocket cost. Patient verbalized understanding.

## 2016-11-24 MED FILL — HYDROCHLOROTHIAZIDE 25 MG T: 25 | 30 days supply | Qty: 30 | Fill #1

## 2016-12-23 ENCOUNTER — Ambulatory Visit: Payer: 59 | Admitting: Family Medicine

## 2016-12-23 MED FILL — HYDROCHLOROTHIAZIDE 25 MG T: 25 | 90 days supply | Qty: 90 | Fill #2

## 2017-02-04 ENCOUNTER — Other Ambulatory Visit: Payer: Self-pay | Admitting: Family Medicine

## 2017-02-04 DIAGNOSIS — Z1231 Encounter for screening mammogram for malignant neoplasm of breast: Secondary | ICD-10-CM

## 2017-03-15 ENCOUNTER — Ambulatory Visit (HOSPITAL_COMMUNITY)
Admission: RE | Admit: 2017-03-15 | Discharge: 2017-03-15 | Disposition: A | Discharge status: Home / Self Care | Payer: 59 | Source: Ambulatory Visit | Attending: Family Medicine | Admitting: Family Medicine

## 2017-03-15 ENCOUNTER — Encounter (HOSPITAL_COMMUNITY): Payer: Self-pay | Admitting: Radiology

## 2017-03-15 DIAGNOSIS — Z1231 Encounter for screening mammogram for malignant neoplasm of breast: Secondary | ICD-10-CM | POA: Diagnosis not present

## 2017-03-22 MED FILL — HYDROCHLOROTHIAZIDE 25 MG T: 25 | 60 days supply | Qty: 60 | Fill #3

## 2017-03-29 ENCOUNTER — Encounter (HOSPITAL_COMMUNITY): Payer: Self-pay | Admitting: Emergency Medicine

## 2017-03-29 ENCOUNTER — Emergency Department (HOSPITAL_COMMUNITY)
Admission: EM | Admit: 2017-03-29 | Discharge: 2017-03-29 | Disposition: A | Discharge status: Home / Self Care | Payer: 59 | Attending: Emergency Medicine | Admitting: Emergency Medicine

## 2017-03-29 ENCOUNTER — Emergency Department (HOSPITAL_COMMUNITY): Payer: 59

## 2017-03-29 DIAGNOSIS — I10 Essential (primary) hypertension: Secondary | ICD-10-CM | POA: Diagnosis not present

## 2017-03-29 DIAGNOSIS — M79675 Pain in left toe(s): Secondary | ICD-10-CM | POA: Diagnosis not present

## 2017-03-29 DIAGNOSIS — M1388 Other specified arthritis, other site: Secondary | ICD-10-CM | POA: Insufficient documentation

## 2017-03-29 DIAGNOSIS — M19072 Primary osteoarthritis, left ankle and foot: Secondary | ICD-10-CM | POA: Diagnosis not present

## 2017-03-29 DIAGNOSIS — M1990 Unspecified osteoarthritis, unspecified site: Secondary | ICD-10-CM

## 2017-03-29 DIAGNOSIS — Z79899 Other long term (current) drug therapy: Secondary | ICD-10-CM | POA: Diagnosis not present

## 2017-03-29 LAB — CBC WITH DIFFERENTIAL/PLATELET
BASOS ABS: 0 10*3/uL (ref 0.0–0.1)
Basophils Relative: 0 %
Eosinophils Absolute: 0.2 10*3/uL (ref 0.0–0.7)
Eosinophils Relative: 2 %
HEMATOCRIT: 39.7 % (ref 36.0–46.0)
HEMOGLOBIN: 13.3 g/dL (ref 12.0–15.0)
LYMPHS PCT: 29 %
Lymphs Abs: 2.8 10*3/uL (ref 0.7–4.0)
MCH: 30.2 pg (ref 26.0–34.0)
MCHC: 33.5 g/dL (ref 30.0–36.0)
MCV: 90 fL (ref 78.0–100.0)
MONO ABS: 0.9 10*3/uL (ref 0.1–1.0)
Monocytes Relative: 10 %
NEUTROS ABS: 5.6 10*3/uL (ref 1.7–7.7)
NEUTROS PCT: 59 %
Platelets: 259 10*3/uL (ref 150–400)
RBC: 4.41 MIL/uL (ref 3.87–5.11)
RDW: 13.3 % (ref 11.5–15.5)
WBC: 9.5 10*3/uL (ref 4.0–10.5)

## 2017-03-29 LAB — URIC ACID: Uric Acid, Serum: 5.9 mg/dL (ref 2.3–6.6)

## 2017-03-29 MED ORDER — NAPROXEN 500 MG PO TABS: 500.0000 mg | ORAL_TABLET | Freq: Two times a day (BID) | ORAL | 0 refills | Status: DC

## 2017-03-29 MED ORDER — HYDROCODONE-ACETAMINOPHEN 5-325 MG PO TABS: 1.0000 | ORAL_TABLET | ORAL | 0 refills | Status: DC | PRN

## 2017-03-29 MED ORDER — NAPROXEN 250 MG PO TABS
500.0000 mg | ORAL_TABLET | Freq: Once | ORAL | Status: AC
  Administered 2017-03-29: 500 mg via ORAL
  Filled 2017-03-29: qty 2

## 2017-03-29 NOTE — Discharge Instructions (Signed)
It is possible that your pain is from the mild arthritis seen in your great toe xray today, however, arthritis slowly develops over time and doesn't quite fit the symptoms for your sudden onset of pain.  Elevate and apply ice to the toe, wear the post op shoe for comfort.  Take the naproxen and you may take the hydrocodone prescribed for pain relief.  This will make you drowsy - do not drive within 4 hours of taking this medication.  Call your primary doctor for a recheck in one week if your symptoms persist.  You may also benefit by seeing a podiatrist if your symptoms do not improve and have been referred to Dr. Logan BoresEvans.

## 2017-03-29 NOTE — ED Provider Notes (Signed)
[facility]PT Provider Note   CSN: [id number] Arrival date & time: 03/29/17  1626     History   Chief Complaint Chief Complaint  Patient presents with  . Foot Pain    HPI Gabrielle Richards is a 46 y.o. female presenting with pain and swelling beneath her left great toe which she woke with today.  She denies injury, rash, punctures, insect bites or any other reason for source of symptoms.  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  She has had no tx prior to arrival.  No personal or family hx of gout.  The history is provided by the patient.    Past Medical History:  Diagnosis Date  . Bulging lumbar disc   . Chlamydia 04/05/2014  . History of abnormal cervical Pap smear 03/06/2014  . History of chlamydia   . Hyperlipidemia   . Hypertension   . Menorrhagia with irregular cycle 03/14/2015  . Swelling 03/06/2014  . Vaginal Pap smear, abnormal     Patient Active Problem List   Diagnosis Date Noted  . Menorrhagia with irregular cycle 03/14/2015  . Situational depression 02/13/2015  . Vaginal spotting 05/07/2014  . Chlamydia 04/05/2014  . Morbid obesity (HCC) 03/07/2014  . Routine general medical examination at a health care facility 03/07/2014  . Swelling 03/06/2014  . History of abnormal cervical Pap smear 03/06/2014  . Hyperlipidemia 12/13/2011  . Neck pain 06/05/2011  . Lumbar back pain 06/05/2011    Past Surgical History:  Procedure Laterality Date  . DILITATION & CURRETTAGE/HYSTROSCOPY WITH THERMACHOICE ABLATION N/A 11/21/2012   Procedure: DILATATION & CURETTAGE/HYSTEROSCOPY WITH THERMACHOICE ABLATION;  Surgeon: Lazaro ArmsLuther H Eure, MD;  Location: [facility];  Service: Gynecology;  Laterality: N/A;  Total Therapy Time: 9:22   Temperature:87              Balloon: 16 ml's in and 16 ml's out      . [facility]ATION  [facility]     OB History    Gravida Para Term Preterm AB Living   2 2       2    SAB TAB Ectopic Multiple Live Births                   Home Medications    Prior to Admission medications   Medication Sig Start Date End Date Taking? Authorizing Provider  Cholecalciferol (VITAMIN D3) 1000 UNITS CHEW Chew 1 tablet by mouth daily.    [provider]  hydrochlorothiazide (HYDRODIURIL) 25 MG tablet TAKE 1 TABLET BY MOUTH DAILY. 10/21/16   Salley Scarleturham, Kawanta F, MD  HYDROcodone-acetaminophen (NORCO/VICODIN) 5-325 MG tablet Take 1 tablet by mouth every 4 (four) hours as needed. 03/29/17   Burgess AmorIdol, Malgorzata Albert, PA-C  Lorcaserin HCl 10 MG TABS Take one po bid 09/25/16   Merlyn AlbertLuking, William S, MD  naproxen (NAPROSYN) 500 MG tablet Take 1 tablet (500 mg total) by mouth 2 (two) times daily. 03/29/17   Burgess AmorIdol, Lennart Gladish, PA-C    Family History Family History  Problem Relation Age of Onset  . Diabetes Mother   . Diabetes Brother   . Hyperlipidemia Brother     Social History Social History  Substance Use Topics  . Smoking status: Never Smoker  . Smokeless tobacco: Never Used  . Alcohol use Yes     Comment: socially  Allergies   Contrave [naltrexone-bupropion hcl er]   Review of Systems Review of Systems  Constitutional: Negative for fever.  Musculoskeletal: Positive for arthralgias and joint swelling. Negative for myalgias.  Skin: Negative.  Negative for color change and wound.  Neurological: Negative for weakness and numbness.     Physical Exam Updated Vital Signs BP 134/68 (BP Location: Right Arm)   Pulse 76   Temp 97.7 F (36.5 C) (Oral)   Resp 18   Ht 5\' 2"  (1.575 m)   Wt 117.9 kg (260 lb)   SpO2 100%   BMI 47.55 kg/m   Physical Exam  Constitutional: She appears well-developed and well-nourished.  HENT:  Head: Atraumatic.  Neck: Normal range of motion.  Cardiovascular:  Pulses equal bilaterally  Musculoskeletal: She exhibits tenderness.       Left foot: There is tenderness and  swelling. There is normal capillary refill, no crepitus and no deformity.       Feet:  ttp with edema left great toe, plantar and medial aspect.  Non tender to light touch. No erythema, skin intact.  Mildly increased warmth at site of swelling.  Neurological: She is alert. She has normal strength. She displays normal reflexes. No sensory deficit.  Skin: Skin is warm and dry.  Psychiatric: She has a normal mood and affect.     ED Treatments / Results  Labs (all labs ordered are listed, but only abnormal results are displayed) Labs Reviewed  CBC WITH DIFFERENTIAL/PLATELET  URIC ACID    EKG  EKG Interpretation None       Radiology Dg Foot Complete Left  Result Date: 03/29/2017 CLINICAL DATA:  46 y/o F; left foot pain, predominantly left first digit. EXAM: LEFT FOOT - COMPLETE 3+ VIEW COMPARISON:  None. FINDINGS: No acute fracture or dislocation. Lisfranc alignment is maintained. Mild osteoarthrosis of the first metatarsophalangeal joint with small periarticular osteophytes. Dorsal and plantar calcaneal enthesophytes. No bony erosive changes or soft tissue mineralization. IMPRESSION: Mild osteoarthrosis of the first metatarsophalangeal joint. Dorsal and plantar calcaneal enthesophytes. Electronically Signed   By: Mitzi Hansen M.D.   On: 03/29/2017 17:08    Procedures Procedures (including critical care time)  Medications Ordered in ED Medications  naproxen (NAPROSYN) tablet 500 mg (500 mg Oral Given 03/29/17 1719)     Initial Impression / Assessment and Plan / ED Course  I have reviewed the triage vital signs and the nursing notes.  Pertinent labs & imaging results that were available during my care of the patient were reviewed by me and considered in my medical decision making (see chart for details).     Labs and imaging reviewed.  No elevated uric acid level to suggest gout, wbc count normal range.  Mild degenerative changes could possibly acct for some sx, but  pain was rather sudden onset.  Doubt stress fx.  She was placed in post op shoe, Gabrielle dressing for padding inside the shoe, ice, elevation, plan f/u with podiatry if sx persist.    The patient appears reasonably screened and/or stabilized for discharge and I doubt any other medical condition or other [facility] requiring further screening, evaluation, or treatment in the ED at this time prior to discharge.   Final Clinical Impressions(s) / ED Diagnoses   Final diagnoses:  Great toe pain, left  Arthritis    New Prescriptions Discharge Medication List as of 03/29/2017  6:52 PM    START taking these medications   Details  HYDROcodone-acetaminophen (NORCO/VICODIN) 5-325 MG tablet Take 1  tablet by mouth every 4 (four) hours as needed., Starting Mon 03/29/2017, Print    naproxen (NAPROSYN) 500 MG tablet Take 1 tablet (500 mg total) by mouth 2 (two) times daily., Starting Mon 03/29/2017, Print         Burgess Amor, PA-C 03/29/17 Clayborn Heron, MD 03/31/17 [phone]

## 2017-03-29 NOTE — ED Notes (Signed)
Pt returned from xray

## 2017-03-29 NOTE — ED Triage Notes (Signed)
Pt reports waking with pain in left foot with no injury.

## 2017-04-01 ENCOUNTER — Ambulatory Visit (INDEPENDENT_AMBULATORY_CARE_PROVIDER_SITE_OTHER): Payer: 59 | Admitting: Podiatry

## 2017-04-01 ENCOUNTER — Encounter: Payer: Self-pay | Admitting: Podiatry

## 2017-04-01 VITALS — BP 101/58 | HR 63

## 2017-04-01 DIAGNOSIS — M205X2 Other deformities of toe(s) (acquired), left foot: Secondary | ICD-10-CM

## 2017-04-01 DIAGNOSIS — M2022 Hallux rigidus, left foot: Secondary | ICD-10-CM

## 2017-04-01 DIAGNOSIS — M779 Enthesopathy, unspecified: Secondary | ICD-10-CM | POA: Diagnosis not present

## 2017-04-01 MED ORDER — TRIAMCINOLONE ACETONIDE 10 MG/ML IJ SUSP
10.0000 mg | Freq: Once | INTRAMUSCULAR | Status: AC
  Administered 2017-04-01: 10 mg

## 2017-04-01 NOTE — Progress Notes (Signed)
Subjective:    Patient ID: Gabrielle BroomJuliette F Richards, female   DOB: 46 y.o.   MRN: [id number]   HPI patient presents with significant discomfort around the big toe joint left stating it started and she's not sure of any injury or what may have caused. States that it's very tender with walking and she's wearing a surgical shoe and had gone to the emergency room and had x-rays done    Review of Systems  All other systems reviewed and are negative.       Objective:  Physical Exam  Cardiovascular: Intact distal pulses.   Musculoskeletal: Normal range of motion.  Neurological: She is alert.  Skin: Skin is warm.  Nursing note and vitals reviewed.  neurovascular status intact muscle strength adequate range of motion within normal limits with patient found to have inflammation pain around the first MPJ left with fluid buildup and mild limitation of motion of the joint. It is localized to this area with good digital perfusion and well oriented 3 noted     Assessment:  Inflammatory capsulitis of the first MPJ left with hallux limitus of a mild to moderate nature       Plan:    H&P x-rays reviewed and at this time I went ahead and injected around the first MPJ left 3 mg Kenalog 5 g Xylocaine and if it does not improve in the next 2-3 weeks I want to see her back  X-rays indicate that there is some slight spurring but no indication of fracture or advanced arthritis

## 2017-04-01 NOTE — Progress Notes (Signed)
   Subjective:    Patient ID: Gabrielle Richards, female    DOB: 02/06/1971, 46 y.o.   MRN: [id number]  HPI  Chief Complaint  Patient presents with  . Toe Pain    Left great toe, sharp pain/ball of foot onset 1 week       Review of Systems  Musculoskeletal: Positive for gait problem and joint swelling.  All other systems reviewed and are negative.      Objective:   Physical Exam        Assessment & Plan:

## 2017-05-06 ENCOUNTER — Other Ambulatory Visit: Payer: Self-pay | Admitting: Family Medicine

## 2017-05-10 ENCOUNTER — Other Ambulatory Visit: Payer: Self-pay | Admitting: Family Medicine

## 2017-05-11 ENCOUNTER — Other Ambulatory Visit: Payer: Self-pay | Admitting: *Deleted

## 2017-05-14 MED FILL — HYDROCHLOROTHIAZIDE 25 MG T: 25 | 90 days supply | Qty: 90 | Fill #0

## 2017-05-18 NOTE — Telephone Encounter (Signed)
Call pt what does she take for? How frequently?

## 2017-08-26 MED FILL — HYDROCHLOROTHIAZIDE 25 MG T: 25 | 90 days supply | Qty: 90 | Fill #1

## 2017-10-28 DIAGNOSIS — H52223 Regular astigmatism, bilateral: Secondary | ICD-10-CM | POA: Diagnosis not present

## 2017-10-28 DIAGNOSIS — H5212 Myopia, left eye: Secondary | ICD-10-CM | POA: Diagnosis not present

## 2017-10-28 DIAGNOSIS — H524 Presbyopia: Secondary | ICD-10-CM | POA: Diagnosis not present

## 2017-11-10 ENCOUNTER — Telehealth: Payer: Self-pay | Admitting: Family Medicine

## 2017-11-10 DIAGNOSIS — E785 Hyperlipidemia, unspecified: Secondary | ICD-10-CM

## 2017-11-10 DIAGNOSIS — R5383 Other fatigue: Secondary | ICD-10-CM

## 2017-11-10 DIAGNOSIS — Z79899 Other long term (current) drug therapy: Secondary | ICD-10-CM

## 2017-11-10 NOTE — Telephone Encounter (Signed)
No labs ordered by dr Brett Canalessteve in epic.

## 2017-11-10 NOTE — Telephone Encounter (Signed)
Lip liv m7 cbc 

## 2017-11-10 NOTE — Telephone Encounter (Signed)
Patient has appointment in April for physical and needing lab done.

## 2017-11-11 NOTE — Telephone Encounter (Signed)
Orders put in. Pt notified.  

## 2017-12-16 MED FILL — HYDROCHLOROTHIAZIDE 25 MG T: 25 | 30 days supply | Qty: 30 | Fill #2

## 2017-12-20 DIAGNOSIS — Z79899 Other long term (current) drug therapy: Secondary | ICD-10-CM | POA: Diagnosis not present

## 2017-12-20 DIAGNOSIS — R5383 Other fatigue: Secondary | ICD-10-CM | POA: Diagnosis not present

## 2017-12-20 DIAGNOSIS — E785 Hyperlipidemia, unspecified: Secondary | ICD-10-CM | POA: Diagnosis not present

## 2017-12-21 LAB — BASIC METABOLIC PANEL
BUN/Creatinine Ratio: 13 (ref 9–23)
BUN: 11 mg/dL (ref 6–24)
CALCIUM: 10.5 mg/dL — AB (ref 8.7–10.2)
CO2: 24 mmol/L (ref 20–29)
CREATININE: 0.85 mg/dL (ref 0.57–1.00)
Chloride: 101 mmol/L (ref 96–106)
GFR calc Af Amer: 93 mL/min/{1.73_m2} (ref >59)
GFR, EST NON AFRICAN AMERICAN: 81 mL/min/{1.73_m2} (ref >59)
GLUCOSE: 98 mg/dL (ref 65–99)
Potassium: 4.7 mmol/L (ref 3.5–5.2)
SODIUM: 139 mmol/L (ref 134–144)

## 2017-12-21 LAB — CBC WITH DIFFERENTIAL/PLATELET
Basophils Absolute: 0 10*3/uL (ref 0.0–0.2)
Basos: 0 %
EOS (ABSOLUTE): 0.2 10*3/uL (ref 0.0–0.4)
EOS: 2 %
HEMATOCRIT: 37.7 % (ref 34.0–46.6)
Hemoglobin: 12.6 g/dL (ref 11.1–15.9)
IMMATURE GRANS (ABS): 0 10*3/uL (ref 0.0–0.1)
IMMATURE GRANULOCYTES: 0 %
Lymphocytes Absolute: 2.1 10*3/uL (ref 0.7–3.1)
Lymphs: 27 %
MCH: 29.8 pg (ref 26.6–33.0)
MCHC: 33.4 g/dL (ref 31.5–35.7)
MCV: 89 fL (ref 79–97)
MONOCYTES: 9 %
MONOS ABS: 0.7 10*3/uL (ref 0.1–0.9)
Neutrophils Absolute: 4.8 10*3/uL (ref 1.4–7.0)
Neutrophils: 62 %
Platelets: 334 10*3/uL (ref 150–379)
RBC: 4.23 x10E6/uL (ref 3.77–5.28)
RDW: 14.5 % (ref 12.3–15.4)
WBC: 7.7 10*3/uL (ref 3.4–10.8)

## 2017-12-21 LAB — LIPID PANEL
CHOL/HDL RATIO: 3.9 ratio (ref 0.0–4.4)
Cholesterol, Total: 240 mg/dL — ABNORMAL HIGH (ref 100–199)
HDL: 62 mg/dL (ref >39)
LDL Calculated: 158 mg/dL — ABNORMAL HIGH (ref 0–99)
Triglycerides: 98 mg/dL (ref 0–149)
VLDL CHOLESTEROL CAL: 20 mg/dL (ref 5–40)

## 2017-12-21 LAB — HEPATIC FUNCTION PANEL
ALBUMIN: 4.1 g/dL (ref 3.5–5.5)
ALT: 10 IU/L (ref 0–32)
AST: 15 IU/L (ref 0–40)
Alkaline Phosphatase: 60 IU/L (ref 39–117)
Bilirubin Total: 0.3 mg/dL (ref 0.0–1.2)
Bilirubin, Direct: 0.07 mg/dL (ref 0.00–0.40)
TOTAL PROTEIN: 7.1 g/dL (ref 6.0–8.5)

## 2017-12-30 ENCOUNTER — Encounter: Payer: Self-pay | Admitting: Family Medicine

## 2017-12-30 ENCOUNTER — Ambulatory Visit (INDEPENDENT_AMBULATORY_CARE_PROVIDER_SITE_OTHER): Payer: 59 | Admitting: Family Medicine

## 2017-12-30 VITALS — BP 120/74 | Ht 62.0 in | Wt 231.0 lb

## 2017-12-30 DIAGNOSIS — A599 Trichomoniasis, unspecified: Secondary | ICD-10-CM | POA: Diagnosis not present

## 2017-12-30 DIAGNOSIS — M7662 Achilles tendinitis, left leg: Secondary | ICD-10-CM | POA: Diagnosis not present

## 2017-12-30 DIAGNOSIS — Z124 Encounter for screening for malignant neoplasm of cervix: Secondary | ICD-10-CM

## 2017-12-30 DIAGNOSIS — Z0000 Encounter for general adult medical examination without abnormal findings: Secondary | ICD-10-CM | POA: Diagnosis not present

## 2017-12-30 DIAGNOSIS — Z0001 Encounter for general adult medical examination with abnormal findings: Secondary | ICD-10-CM | POA: Diagnosis not present

## 2017-12-30 MED ORDER — HYDROCHLOROTHIAZIDE 25 MG PO TABS: 25.0000 mg | ORAL_TABLET | Freq: Every day | ORAL | 5 refills | Status: DC

## 2017-12-30 MED ORDER — METRONIDAZOLE 500 MG PO TABS: ORAL_TABLET | ORAL | 0 refills | Status: DC

## 2017-12-30 NOTE — Progress Notes (Signed)
Subjective:    Patient ID: Gabrielle Richards, female    DOB: 1971/01/21, 47 y.o.   MRN: [id number]  HPI The patient comes in today for a wellness visit.    A review of their health history was completed.  A review of medications was also completed.  Any needed refills; hctz  Eating habits: health conscious. Has lost 30lbs.  Falls/  MVA accidents in past few months: none  Regular exercise: none  Specialist pt sees on regular basis: none  Preventative health issues were discussed.   Additional concerns: vit D, stiffness in left leg, ankles swelling    Results for orders placed or performed in visit on 11/10/17  Lipid panel  Result Value Ref Range   Cholesterol, Total 240 (H) 100 - 199 mg/dL   Triglycerides 98 0 - 149 mg/dL   HDL 62 >40 mg/dL   VLDL Cholesterol Cal 20 5 - 40 mg/dL   LDL Calculated 981 (H) 0 - 99 mg/dL   Chol/HDL Ratio 3.9 0.0 - 4.4 ratio  Hepatic function panel  Result Value Ref Range   Total Protein 7.1 6.0 - 8.5 g/dL   Albumin 4.1 3.5 - 5.5 g/dL   Bilirubin Total 0.3 0.0 - 1.2 mg/dL   Bilirubin, Direct 1.91 0.00 - 0.40 mg/dL   Alkaline Phosphatase 60 39 - 117 IU/L   AST 15 0 - 40 IU/L   ALT 10 0 - 32 IU/L  Basic metabolic panel  Result Value Ref Range   Glucose 98 65 - 99 mg/dL   BUN 11 6 - 24 mg/dL   Creatinine, Ser 4.78 0.57 - 1.00 mg/dL   GFR calc non Af Amer 81 >59 mL/min/1.73   GFR calc Af Amer 93 >59 mL/min/1.73   BUN/Creatinine Ratio 13 9 - 23   Sodium 139 134 - 144 mmol/L   Potassium 4.7 3.5 - 5.2 mmol/L   Chloride 101 96 - 106 mmol/L   CO2 24 20 - 29 mmol/L   Calcium 10.5 (H) 8.7 - 10.2 mg/dL  CBC with Differential/Platelet  Result Value Ref Range   WBC 7.7 3.4 - 10.8 x10E3/uL   RBC 4.23 3.77 - 5.28 x10E6/uL   Hemoglobin 12.6 11.1 - 15.9 g/dL   Hematocrit 29.5 62.1 - 46.6 %   MCV 89 79 - 97 fL   MCH 29.8 26.6 - 33.0 pg   MCHC 33.4 31.5 - 35.7 g/dL   RDW 30.8 65.7 - 84.6 %   Platelets 334 150 - 379 x10E3/uL   Neutrophils 62  Not Estab. %   Lymphs 27 Not Estab. %   Monocytes 9 Not Estab. %   Eos 2 Not Estab. %   Basos 0 Not Estab. %   Neutrophils Absolute 4.8 1.4 - 7.0 x10E3/uL   Lymphocytes Absolute 2.1 0.7 - 3.1 x10E3/uL   Monocytes Absolute 0.7 0.1 - 0.9 x10E3/uL   EOS (ABSOLUTE) 0.2 0.0 - 0.4 x10E3/uL   Basophils Absolute 0.0 0.0 - 0.2 x10E3/uL   Immature Granulocytes 0 Not Estab. %   Immature Grans (Abs) 0.0 0.0 - 0.1 x10E3/uL    Stiffness of the left leg,  Achey, partic after resting,  Swelling ofboth ankes   Back of ankle pain and discomfort   Salt intake does not add    Review of Systems  Constitutional: Negative for activity change, appetite change and fatigue.  HENT: Negative for congestion and rhinorrhea.   Eyes: Negative for discharge.  Respiratory: Negative for cough, chest tightness and wheezing.  Cardiovascular: Negative for chest pain.  Gastrointestinal: Negative for abdominal pain, blood in stool and vomiting.  Endocrine: Negative for polyphagia.  Genitourinary: Negative for difficulty urinating and frequency.  Musculoskeletal: Negative for neck pain.  Skin: Negative for color change.  Allergic/Immunologic: Negative for environmental allergies and food allergies.  Neurological: Negative for weakness and headaches.  Psychiatric/Behavioral: Negative for agitation and behavioral problems.  All other systems reviewed and are negative.      Objective:   Physical Exam  Constitutional: She is oriented to person, place, and time. She appears well-developed and well-nourished.  HENT:  Head: Normocephalic and atraumatic.  Right Ear: External ear normal.  Left Ear: External ear normal.  Eyes: Right eye exhibits no discharge. Left eye exhibits no discharge.  Neck: Normal range of motion. No tracheal deviation present.  Cardiovascular: Normal rate, regular rhythm, normal heart sounds and intact distal pulses. Exam reveals no gallop.  No murmur heard. Pulmonary/Chest: Effort normal  and breath sounds normal. No stridor. No respiratory distress. She has no wheezes. She has no rales.  Abdominal: Soft. Bowel sounds are normal. She exhibits no distension and no mass. There is no tenderness. There is no rebound and no guarding.  Genitourinary: Vagina normal and uterus normal.  Musculoskeletal: Normal range of motion. She exhibits no edema or tenderness.  Trace edema bilateral ankles, left posterior distal Achilles tendon tenderness  Lymphadenopathy:    She has no cervical adenopathy.  Neurological: She is alert and oriented to person, place, and time. She exhibits normal muscle tone.  Skin: Skin is warm and dry.  Psychiatric: She has a normal mood and affect. Her behavior is normal.          Assessment & Plan:  #1 wellness exam.  Up-to-date on mammograms.  Diet discussed.  Exercise discussed.  Patient has lost some weight and encouraged on this.  2.  Trichomoniasis discussed proper treatment prescribed also needs to contact partner #3 trace ankle edema.  Daily diuretic appropriate.  Chronic venous stasis discussed  4.  Left posterior ankle Achilles tendon inflammation discussed stretching exercises encourage  Blood work reviewed/ vit D 3 2000 miu forever daily

## 2017-12-30 NOTE — Patient Instructions (Signed)
Your diagnosis as far as the cervix is trichomoniasis, plz have your partner get treated  Take vitamin D3 2000 milliunits daily from here on out   Cholesterol Cholesterol is a white, waxy, fat-like substance that is needed by the human body in small amounts. The liver makes all the cholesterol we need. Cholesterol is carried from the liver by the blood through the blood vessels. Deposits of cholesterol (plaques) may build up on blood vessel (artery) walls. Plaques make the arteries narrower and stiffer. Cholesterol plaques increase the risk for heart attack and stroke. You cannot feel your cholesterol level even if it is very high. The only way to know that it is high is to have a blood test. Once you know your cholesterol levels, you should keep a record of the test results. Work with your health care provider to keep your levels in the desired range. What do the results mean?  Total cholesterol is a rough measure of all the cholesterol in your blood.  LDL (low-density lipoprotein) is the "bad" cholesterol. This is the type that causes plaque to build up on the artery walls. You want this level to be low.  HDL (high-density lipoprotein) is the "good" cholesterol because it cleans the arteries and carries the LDL away. You want this level to be high.  Triglycerides are fat that the body can either burn for energy or store. High levels are closely linked to heart disease. What are the desired levels of cholesterol?  Total cholesterol below 200.  LDL below 100 for people who are at risk, below 70 for people at very high risk.  HDL above 40 is good. A level of 60 or higher is considered to be protective against heart disease.  Triglycerides below 150. How can I lower my cholesterol? Diet Follow your diet program as told by your health care provider.  Choose fish or white meat chicken and [place], roasted or baked. Limit fatty cuts of red meat, fried foods, and processed meats, such as  sausage and lunch meats.  Eat lots of fresh fruits and vegetables.  Choose whole grains, beans, pasta, potatoes, and cereals.  Choose olive oil, corn oil, or canola oil, and use only small amounts.  Avoid butter, mayonnaise, shortening, or palm kernel oils.  Avoid foods with trans fats.  Drink skim or nonfat milk and eat low-fat or nonfat yogurt and cheeses. Avoid whole milk, cream, ice cream, egg yolks, and full-fat cheeses.  Healthier desserts include angel food cake, ginger snaps, animal crackers, hard candy, popsicles, and low-fat or nonfat frozen yogurt. Avoid pastries, cakes, pies, and cookies.  Exercise  Follow your exercise program as told by your health care provider. A regular program: ? Helps to decrease LDL and raise HDL. ? Helps with weight control.  Do things that increase your activity level, such as gardening, walking, and taking the stairs.  Ask your health care provider about ways that you can be more active in your daily life.  Medicine  Take over-the-counter and prescription medicines only as told by your health care provider. ? Medicine may be prescribed by your health care provider to help lower cholesterol and decrease the risk for heart disease. This is usually done if diet and exercise have failed to bring down cholesterol levels. ? If you have several risk factors, you may need medicine even if your levels are normal.  This information is not intended to replace advice given to you by your health care provider. Make sure you discuss  any questions you have with your health care provider. Document Released: 06/02/2001 Document Revised: 04/04/2016 Document Reviewed: 03/07/2016 Elsevier Interactive Patient Education  [employer].

## 2018-01-01 ENCOUNTER — Encounter: Payer: Self-pay | Admitting: Family Medicine

## 2018-01-01 LAB — PAP IG W/ RFLX HPV ASCU: PAP Smear Comment: 0

## 2018-01-18 ENCOUNTER — Other Ambulatory Visit: Payer: Self-pay | Admitting: *Deleted

## 2018-01-18 MED ORDER — HYDROCHLOROTHIAZIDE 25 MG PO TABS: 25.0000 mg | ORAL_TABLET | Freq: Every day | ORAL | 1 refills | Status: DC

## 2018-01-18 MED FILL — HYDROCHLOROTHIAZIDE 25 MG T: 25 | 90 days supply | Qty: 90 | Fill #0

## 2018-01-22 DIAGNOSIS — J0100 Acute maxillary sinusitis, unspecified: Secondary | ICD-10-CM | POA: Diagnosis not present

## 2018-02-15 ENCOUNTER — Other Ambulatory Visit: Payer: Self-pay | Admitting: Family Medicine

## 2018-02-15 DIAGNOSIS — Z1231 Encounter for screening mammogram for malignant neoplasm of breast: Secondary | ICD-10-CM

## 2018-02-21 ENCOUNTER — Ambulatory Visit (HOSPITAL_COMMUNITY)
Admission: RE | Admit: 2018-02-21 | Discharge: 2018-02-21 | Disposition: A | Discharge status: Home / Self Care | Payer: 59 | Source: Ambulatory Visit | Attending: Family Medicine | Admitting: Family Medicine

## 2018-02-21 DIAGNOSIS — Z1231 Encounter for screening mammogram for malignant neoplasm of breast: Secondary | ICD-10-CM | POA: Insufficient documentation

## 2018-03-17 ENCOUNTER — Ambulatory Visit (HOSPITAL_COMMUNITY)
Admission: RE | Admit: 2018-03-17 | Discharge: 2018-03-17 | Disposition: A | Discharge status: Home / Self Care | Payer: 59 | Source: Ambulatory Visit | Attending: Family Medicine | Admitting: Family Medicine

## 2018-03-17 ENCOUNTER — Other Ambulatory Visit: Payer: Self-pay | Admitting: Family Medicine

## 2018-03-17 DIAGNOSIS — R928 Other abnormal and inconclusive findings on diagnostic imaging of breast: Secondary | ICD-10-CM

## 2018-03-17 DIAGNOSIS — Z1231 Encounter for screening mammogram for malignant neoplasm of breast: Secondary | ICD-10-CM | POA: Diagnosis not present

## 2018-03-22 ENCOUNTER — Ambulatory Visit (HOSPITAL_COMMUNITY)
Admission: RE | Admit: 2018-03-22 | Discharge: 2018-03-22 | Disposition: A | Discharge status: Home / Self Care | Payer: 59 | Source: Ambulatory Visit | Attending: Family Medicine | Admitting: Family Medicine

## 2018-03-22 DIAGNOSIS — R928 Other abnormal and inconclusive findings on diagnostic imaging of breast: Secondary | ICD-10-CM | POA: Diagnosis not present

## 2018-03-22 DIAGNOSIS — R922 Inconclusive mammogram: Secondary | ICD-10-CM | POA: Diagnosis not present

## 2018-04-21 MED FILL — HYDROCHLOROTHIAZIDE 25 MG T: 25 | 90 days supply | Qty: 90 | Fill #1

## 2018-07-13 IMAGING — DX DG FOOT COMPLETE 3+V*L*
3 series · 3 of 3 positions shown · non-contrast
Comparison: None.

CLINICAL DATA: 46 y/o F; left foot pain, predominantly left first
digit.

EXAM:
LEFT FOOT - COMPLETE 3+ VIEW

[foot ap]
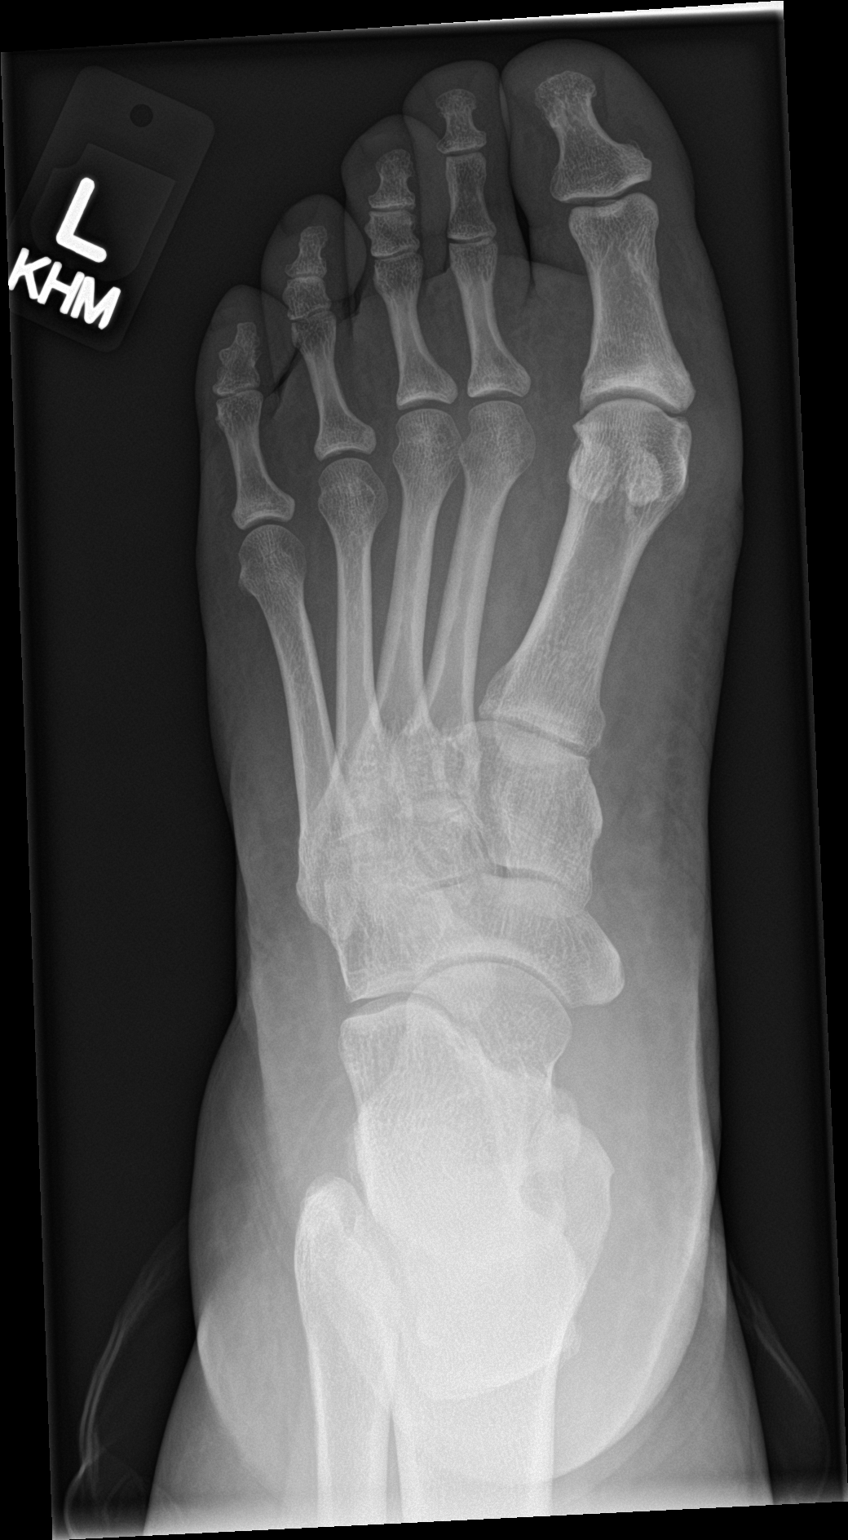

[foot obl]
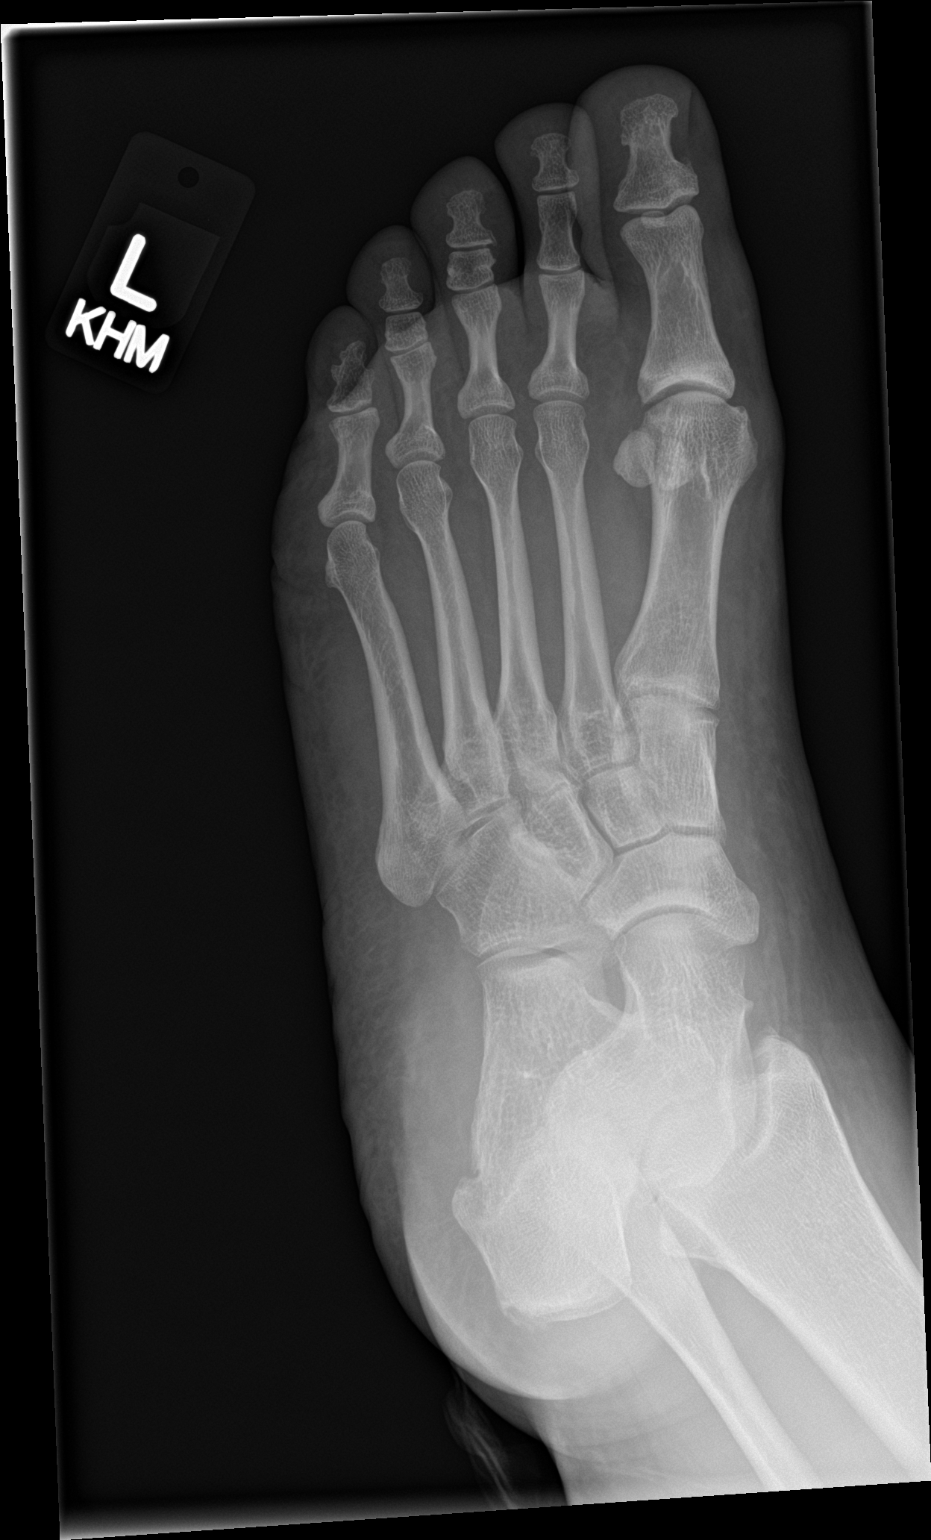

[foot lat]
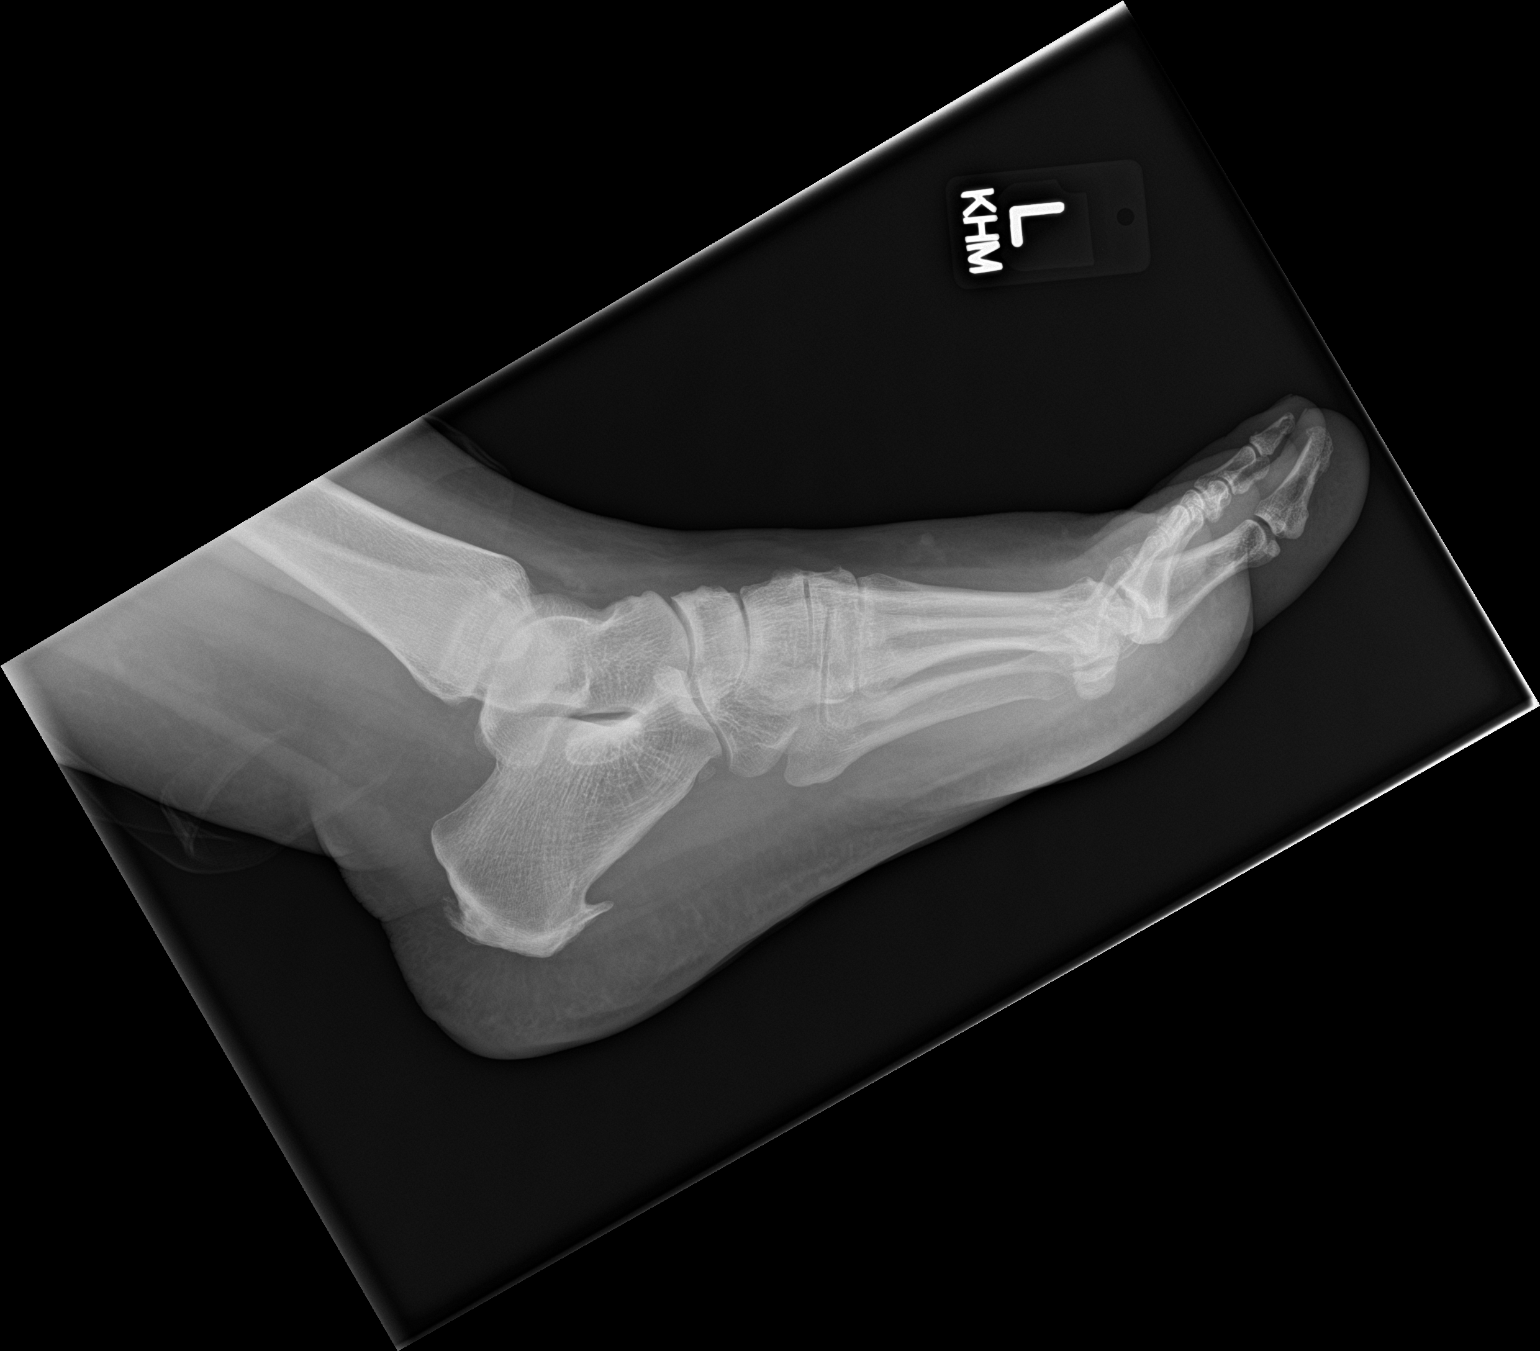

[3 of 3 positions shown; findings below may reference images not displayed]

FINDINGS: No acute fracture or dislocation. Lisfranc alignment is maintained.
Mild osteoarthrosis of the first metatarsophalangeal joint with
small periarticular osteophytes. Dorsal and plantar calcaneal
enthesophytes. No bony erosive changes or soft tissue
mineralization.
IMPRESSION: Mild osteoarthrosis of the first metatarsophalangeal joint. Dorsal
and plantar calcaneal enthesophytes.

By: Gonsalo Kin M.D.

## 2018-07-29 ENCOUNTER — Other Ambulatory Visit: Payer: Self-pay | Admitting: Family Medicine

## 2018-07-29 MED FILL — HYDROCHLOROTHIAZIDE 25 MG T: 25 | 30 days supply | Qty: 30 | Fill #0

## 2018-09-02 ENCOUNTER — Other Ambulatory Visit: Payer: Self-pay | Admitting: Family Medicine

## 2018-09-02 MED FILL — HYDROCHLOROTHIAZIDE 25 MG T: 25 | 30 days supply | Qty: 30 | Fill #0

## 2018-10-04 ENCOUNTER — Other Ambulatory Visit: Payer: Self-pay | Admitting: Family Medicine

## 2018-10-04 MED FILL — HYDROCHLOROTHIAZIDE 25 MG T: 25 | 30 days supply | Qty: 30 | Fill #0

## 2018-10-25 ENCOUNTER — Encounter (HOSPITAL_COMMUNITY): Payer: Self-pay

## 2018-10-25 ENCOUNTER — Ambulatory Visit (HOSPITAL_COMMUNITY)
Admission: EM | Admit: 2018-10-25 | Discharge: 2018-10-25 | Disposition: A | Discharge status: Home / Self Care | Payer: 59 | Attending: Internal Medicine | Admitting: Internal Medicine

## 2018-10-25 DIAGNOSIS — M7662 Achilles tendinitis, left leg: Secondary | ICD-10-CM | POA: Diagnosis not present

## 2018-10-25 MED ORDER — NAPROXEN 500 MG PO TABS: 500.0000 mg | ORAL_TABLET | Freq: Two times a day (BID) | ORAL | 0 refills | Status: DC

## 2018-10-25 NOTE — ED Provider Notes (Signed)
[facility]    CSN: [id number] Arrival date & time: 10/25/18  1311     History   Chief Complaint Chief Complaint  Patient presents with  . Ankle Pain    HPI Gabrielle Richards is a 48 y.o. female with a history of hypertension, hyperlipidemia comes to the urgent care department with left ankle pain for couple of weeks duration.  Patient says symptoms started insidiously and is gotten progressively worse.  Pain is currently worse since the visit to the urgent care department.  Pain is relieved by using warm compress.  Pain is worsened by increased activity.  No febrile episodes.  No trauma to the left ankle.  No other joint pain.  No trauma to the left ankle.  HPI  Past Medical History:  Diagnosis Date  . Bulging lumbar disc   . Chlamydia 04/05/2014  . History of abnormal cervical Pap smear 03/06/2014  . History of chlamydia   . Hyperlipidemia   . Hypertension   . Menorrhagia with irregular cycle 03/14/2015  . Swelling 03/06/2014  . Vaginal Pap smear, abnormal     Patient Active Problem List   Diagnosis Date Noted  . Menorrhagia with irregular cycle 03/14/2015  . Situational depression 02/13/2015  . Vaginal spotting 05/07/2014  . Chlamydia 04/05/2014  . Morbid obesity (HCC) 03/07/2014  . Routine general medical examination at a health care facility 03/07/2014  . Swelling 03/06/2014  . History of abnormal cervical Pap smear 03/06/2014  . Hyperlipidemia 12/13/2011  . Neck pain 06/05/2011  . Lumbar back pain 06/05/2011    Past Surgical History:  Procedure Laterality Date  . DILITATION & CURRETTAGE/HYSTROSCOPY WITH THERMACHOICE ABLATION N/A 11/21/2012   Procedure: DILATATION & CURETTAGE/HYSTEROSCOPY WITH THERMACHOICE ABLATION;  Surgeon: Lazaro ArmsLuther H Eure, MD;  Location: [facility];  Service: Gynecology;  Laterality: N/A;  Total Therapy Time: 9:22   Temperature:87              Balloon: 16 ml's in and 16 ml's out      . TUBAL LIGATION  1997   [facility]    OB  History    Gravida  2   Para  2   Term      Preterm      AB      Living  2     SAB      TAB      Ectopic      Multiple      Live Births               Home Medications    Prior to Admission medications   Medication Sig Start Date End Date Taking? Authorizing Provider  Cholecalciferol (VITAMIN D3) 1000 UNITS CHEW Chew 1 tablet by mouth daily.    [provider]  hydrochlorothiazide (HYDRODIURIL) 25 MG tablet TAKE 1 TABLET (25 MG TOTAL) BY MOUTH DAILY. 10/04/18   Merlyn AlbertLuking, William S, MD  metroNIDAZOLE (FLAGYL) 500 MG tablet Take 4 tablets. One time dose. 12/30/17   Merlyn AlbertLuking, William S, MD  naproxen (NAPROSYN) 500 MG tablet Take 1 tablet (500 mg total) by mouth 2 (two) times daily. 10/25/18   LampteyBritta Mccreedy, Kaedin Hicklin O, MD    Family History Family History  Problem Relation Age of Onset  . Diabetes Mother   . Diabetes Brother   . Hyperlipidemia Brother     Social History Social History   Tobacco Use  . Smoking status: Never Smoker  . Smokeless tobacco: Never Used  Substance Use  Topics  . Alcohol use: Yes    Comment: socially  . Drug use: No     Allergies   Contrave [naltrexone-bupropion hcl er]   Review of Systems Review of Systems  Constitutional: Negative for activity change and appetite change.  Endocrine: Negative for cold intolerance.  Musculoskeletal: Positive for arthralgias and joint swelling. Negative for back pain, gait problem, myalgias, neck pain and neck stiffness.  Skin: Negative for pallor, rash and wound.  Neurological: Negative for dizziness, weakness and numbness.  Hematological: Negative for adenopathy.     Physical Exam Triage Vital Signs ED Triage Vitals  Enc Vitals Group     BP 10/25/18 1414 (!) 149/88     Pulse Rate 10/25/18 1414 83     Resp 10/25/18 1414 18     Temp 10/25/18 1414 97.8 F (36.6 C)     Temp Source 10/25/18 1414 Oral     SpO2 10/25/18 1414 100 %     Weight --      Height --      Head Circumference --       Peak Flow --      Pain Score 10/25/18 1429 8     Pain Loc --      Pain Edu? --      Excl. in GC? --    No data found.  Updated Vital Signs BP (!) 149/88 (BP Location: Left Arm)   Pulse 83   Temp 97.8 F (36.6 C) (Oral)   Resp 18   SpO2 100%   Visual Acuity Right Eye Distance:   Left Eye Distance:   Bilateral Distance:    Right Eye Near:   Left Eye Near:    Bilateral Near:     Physical Exam Pulmonary:     Effort: Pulmonary effort is normal.     Breath sounds: Normal breath sounds.  Abdominal:     Palpations: There is no mass.     Tenderness: There is no abdominal tenderness.  Musculoskeletal:        General: Swelling and tenderness present. No signs of injury.     Comments: Tender swelling over the left heel Achilles tendon.  No erythema.  Range of motion is preserved.  Skin:    General: Skin is warm and dry.     Capillary Refill: Capillary refill takes less than 2 seconds.  Neurological:     General: No focal deficit present.     Mental Status: She is oriented to person, place, and time.     Cranial Nerves: No cranial nerve deficit.     Sensory: No sensory deficit.     Motor: No weakness.      UC Treatments / Results  Labs (all labs ordered are listed, but only abnormal results are displayed) Labs Reviewed - No data to display  EKG None  Radiology No results found.  Procedures Procedures (including critical care time)  Medications Ordered in UC Medications - No data to display  Initial Impression / Assessment and Plan / UC Course  I have reviewed the triage vital signs and the nursing notes.  Pertinent labs & imaging results that were available during my care of the patient were reviewed by me and considered in my medical decision making (see chart for details).     1.  Left Achilles tendinitis/bursitis Continue anti-inflammatory agents Warm or cold compress over the left heel Patient encouraged to wear comfortable shoes  Final Clinical  Impressions(s) / UC Diagnoses   Final diagnoses:  Achilles bursitis of left lower extremity   Discharge Instructions   None    ED Prescriptions    Medication Sig Dispense Auth. Provider   naproxen (NAPROSYN) 500 MG tablet Take 1 tablet (500 mg total) by mouth 2 (two) times daily. 30 tablet Sylvester Salonga, Britta MccreedyPhilip O, MD     Controlled Substance Prescriptions [place] Controlled Substance Registry consulted? No   Merrilee JanskyLamptey, Paije Goodhart O, MD 10/25/18 [id number]

## 2018-10-25 NOTE — ED Triage Notes (Signed)
Pt presents with left ankle pain not related to any injury.

## 2018-11-03 ENCOUNTER — Other Ambulatory Visit: Payer: Self-pay | Admitting: Family Medicine

## 2018-11-03 MED FILL — HYDROCHLOROTHIAZIDE 25 MG T: 25 | 30 days supply | Qty: 30 | Fill #0

## 2018-11-08 ENCOUNTER — Encounter: Payer: Self-pay | Admitting: Family Medicine

## 2018-11-08 ENCOUNTER — Ambulatory Visit: Payer: 59 | Admitting: Family Medicine

## 2018-11-08 VITALS — BP 118/76 | Temp 98.2°F | Ht 62.0 in | Wt 230.0 lb

## 2018-11-08 DIAGNOSIS — J329 Chronic sinusitis, unspecified: Secondary | ICD-10-CM | POA: Diagnosis not present

## 2018-11-08 MED ORDER — CEFDINIR 300 MG PO CAPS: 300.0000 mg | ORAL_CAPSULE | Freq: Two times a day (BID) | ORAL | 0 refills | Status: DC

## 2018-11-08 NOTE — Progress Notes (Signed)
   Subjective:    Patient ID: Gabrielle Richards, female    DOB: [birth year], 48 y.o.   MRN: [id number]  Cough  This is a new problem. The current episode started in the past 7 days. Associated symptoms include headaches, myalgias, nasal congestion and a sore throat. Treatments tried: theraflu.  aching and hot flashes   Started fri   Got bad sat  theraflu and other agents      Pos prod cough   Energy level puny   Fever a  Sig back acha and body ace   Review of Systems  HENT: Positive for sore throat.   Respiratory: Positive for cough.   Musculoskeletal: Positive for myalgias.  Neurological: Positive for headaches.       Objective:   Physical Exam   Alert vitals reviewed, moderate malaise. Hydration good. Positive nasal congestion lungs no crackles or wheezes, no tachypnea, intermittent bronchial cough during exam heart regular rate and rhythm.      Assessment & Plan:  Impression rhinosinusitis/bronchitis with influenza discussed at length. Ashby Dawes of illness and potential sequela discussed. Plan Tamiflu prescribed if indicated and timing appropriate. Symptom care discussed. Warning signs discussed. WSL

## 2018-11-29 ENCOUNTER — Other Ambulatory Visit: Payer: Self-pay | Admitting: Family Medicine

## 2018-11-29 MED FILL — HYDROCHLOROTHIAZIDE 25 MG T: 25 | 30 days supply | Qty: 30 | Fill #0

## 2018-12-30 ENCOUNTER — Other Ambulatory Visit: Payer: Self-pay | Admitting: Family Medicine

## 2019-01-02 MED FILL — HYDROCHLOROTHIAZIDE 25 MG T: 25 | 30 days supply | Qty: 30 | Fill #0

## 2019-01-28 DIAGNOSIS — H52223 Regular astigmatism, bilateral: Secondary | ICD-10-CM | POA: Diagnosis not present

## 2019-01-28 DIAGNOSIS — H5212 Myopia, left eye: Secondary | ICD-10-CM | POA: Diagnosis not present

## 2019-01-28 DIAGNOSIS — H524 Presbyopia: Secondary | ICD-10-CM | POA: Diagnosis not present

## 2019-01-31 ENCOUNTER — Other Ambulatory Visit: Payer: Self-pay | Admitting: Family Medicine

## 2019-01-31 MED FILL — HYDROCHLOROTHIAZIDE 25 MG T: 25 | 90 days supply | Qty: 90 | Fill #0

## 2019-01-31 NOTE — Telephone Encounter (Signed)
Ok three mo 

## 2019-03-29 ENCOUNTER — Other Ambulatory Visit (HOSPITAL_COMMUNITY): Payer: Self-pay | Admitting: Family Medicine

## 2019-03-29 DIAGNOSIS — Z1231 Encounter for screening mammogram for malignant neoplasm of breast: Secondary | ICD-10-CM

## 2019-04-03 ENCOUNTER — Inpatient Hospital Stay (HOSPITAL_COMMUNITY): Admission: RE | Admit: 2019-04-03 | Payer: 59 | Source: Ambulatory Visit

## 2019-04-12 ENCOUNTER — Other Ambulatory Visit: Payer: Self-pay

## 2019-04-12 ENCOUNTER — Ambulatory Visit (HOSPITAL_COMMUNITY)
Admission: RE | Admit: 2019-04-12 | Discharge: 2019-04-12 | Disposition: A | Discharge status: Home / Self Care | Payer: 59 | Source: Ambulatory Visit | Attending: Family Medicine | Admitting: Family Medicine

## 2019-04-12 DIAGNOSIS — Z1231 Encounter for screening mammogram for malignant neoplasm of breast: Secondary | ICD-10-CM | POA: Diagnosis not present

## 2019-04-25 DIAGNOSIS — Z20828 Contact with and (suspected) exposure to other viral communicable diseases: Secondary | ICD-10-CM | POA: Diagnosis not present

## 2019-05-01 ENCOUNTER — Ambulatory Visit (INDEPENDENT_AMBULATORY_CARE_PROVIDER_SITE_OTHER): Payer: 59 | Admitting: Family Medicine

## 2019-05-01 ENCOUNTER — Encounter: Payer: Self-pay | Admitting: Family Medicine

## 2019-05-01 ENCOUNTER — Other Ambulatory Visit: Payer: Self-pay

## 2019-05-01 VITALS — BP 110/78 | Temp 97.7°F | Ht 62.0 in | Wt 239.0 lb

## 2019-05-01 DIAGNOSIS — I878 Other specified disorders of veins: Secondary | ICD-10-CM

## 2019-05-01 DIAGNOSIS — Z79899 Other long term (current) drug therapy: Secondary | ICD-10-CM | POA: Diagnosis not present

## 2019-05-01 DIAGNOSIS — E785 Hyperlipidemia, unspecified: Secondary | ICD-10-CM | POA: Diagnosis not present

## 2019-05-01 MED ORDER — TRIAMTERENE-HCTZ 37.5-25 MG PO CAPS: 1.0000 | ORAL_CAPSULE | Freq: Every day | ORAL | 3 refills | Status: DC

## 2019-05-01 MED FILL — TRIAMTERENE/HCTZ 37.5/25 CP: 37.5-25 | 90 days supply | Qty: 90 | Fill #0

## 2019-05-01 NOTE — Progress Notes (Signed)
   Subjective:    Patient ID: Gabrielle Richards, female    DOB: 1971-09-13, 48 y.o.   MRN: [id number]  HPI Med check up.  Takes hctz 25mg  one daily for swelling in ankles. Pt states swelling comes and goes. Some days worse than others.     Progress ive swelling of the aniles.  Left ankle is more of a problem with the chronic swelling  Patient not exercising much and stays.  Has gained weight.  States eating more often than usual. Ha gained wight stress eating  Has had no blood work for over a year  Review of Systems No headache, no major weight loss or weight gain, no chest pain no back pain abdominal pain no change in bowel habits complete ROS otherwise negative     Objective:   Physical Exam  Alert vitals stable, NAD. Blood pressure good on repeat. HEENT normal. Lungs clear. Heart regular rate and rhythm. Bilateral 1/2-1+ edema ankles arterial pulses good      Assessment & Plan:  Impression venous stasis from complicated by obesity.  Stop HCTZ.  Start Dyazide diet discussed exercise discussed low-salt encouraged appropriate blood work

## 2019-05-02 LAB — BASIC METABOLIC PANEL
BUN/Creatinine Ratio: 19 (ref 9–23)
BUN: 15 mg/dL (ref 6–24)
CO2: 26 mmol/L (ref 20–29)
Calcium: 10.5 mg/dL — ABNORMAL HIGH (ref 8.7–10.2)
Chloride: 102 mmol/L (ref 96–106)
Creatinine, Ser: 0.8 mg/dL (ref 0.57–1.00)
GFR calc Af Amer: 99 mL/min/{1.73_m2} (ref >59)
GFR calc non Af Amer: 86 mL/min/{1.73_m2} (ref >59)
Glucose: 84 mg/dL (ref 65–99)
Potassium: 4.9 mmol/L (ref 3.5–5.2)
Sodium: 141 mmol/L (ref 134–144)

## 2019-05-02 LAB — HEPATIC FUNCTION PANEL
ALT: 9 IU/L (ref 0–32)
AST: 14 IU/L (ref 0–40)
Albumin: 4.2 g/dL (ref 3.8–4.8)
Alkaline Phosphatase: 70 IU/L (ref 39–117)
Bilirubin Total: 0.4 mg/dL (ref 0.0–1.2)
Bilirubin, Direct: 0.09 mg/dL (ref 0.00–0.40)
Total Protein: 6.9 g/dL (ref 6.0–8.5)

## 2019-05-02 LAB — CBC WITH DIFFERENTIAL/PLATELET
Basophils Absolute: 0 10*3/uL (ref 0.0–0.2)
Basos: 1 %
EOS (ABSOLUTE): 0.2 10*3/uL (ref 0.0–0.4)
Eos: 3 %
Hematocrit: 41.1 % (ref 34.0–46.6)
Hemoglobin: 13.9 g/dL (ref 11.1–15.9)
Immature Grans (Abs): 0 10*3/uL (ref 0.0–0.1)
Immature Granulocytes: 0 %
Lymphocytes Absolute: 2.5 10*3/uL (ref 0.7–3.1)
Lymphs: 32 %
MCH: 29.8 pg (ref 26.6–33.0)
MCHC: 33.8 g/dL (ref 31.5–35.7)
MCV: 88 fL (ref 79–97)
Monocytes Absolute: 0.7 10*3/uL (ref 0.1–0.9)
Monocytes: 9 %
Neutrophils Absolute: 4.5 10*3/uL (ref 1.4–7.0)
Neutrophils: 55 %
Platelets: 290 10*3/uL (ref 150–450)
RBC: 4.66 x10E6/uL (ref 3.77–5.28)
RDW: 13.1 % (ref 11.7–15.4)
WBC: 8 10*3/uL (ref 3.4–10.8)

## 2019-05-02 LAB — LIPID PANEL
Chol/HDL Ratio: 3.9 ratio (ref 0.0–4.4)
Cholesterol, Total: 265 mg/dL — ABNORMAL HIGH (ref 100–199)
HDL: 68 mg/dL (ref >39)
LDL Calculated: 184 mg/dL — ABNORMAL HIGH (ref 0–99)
Triglycerides: 64 mg/dL (ref 0–149)
VLDL Cholesterol Cal: 13 mg/dL (ref 5–40)

## 2019-05-07 ENCOUNTER — Encounter: Payer: Self-pay | Admitting: Family Medicine

## 2019-07-31 MED FILL — TRIAMTERENE/HCTZ 37.5/25 CP: 37.5-25 | 90 days supply | Qty: 90 | Fill #1

## 2019-10-25 ENCOUNTER — Encounter: Payer: Self-pay | Admitting: Family Medicine

## 2019-10-27 MED FILL — TRIAMTERENE/HCTZ 37.5/25 CP: 37.5-25 | 90 days supply | Qty: 90 | Fill #2

## 2019-10-31 ENCOUNTER — Other Ambulatory Visit: Payer: Self-pay

## 2019-10-31 ENCOUNTER — Encounter: Payer: Self-pay | Admitting: Family Medicine

## 2019-10-31 ENCOUNTER — Ambulatory Visit (INDEPENDENT_AMBULATORY_CARE_PROVIDER_SITE_OTHER): Payer: No Typology Code available for payment source | Admitting: Family Medicine

## 2019-10-31 DIAGNOSIS — I878 Other specified disorders of veins: Secondary | ICD-10-CM | POA: Diagnosis not present

## 2019-10-31 DIAGNOSIS — Z6841 Body Mass Index (BMI) 40.0 and over, adult: Secondary | ICD-10-CM

## 2019-10-31 NOTE — Progress Notes (Signed)
   Subjective:  Audio only  Patient ID: Gabrielle Richards, female    DOB: August 01, 1971, 49 y.o.   MRN: [id number]  HPI Pt is thinking toward having weight loss surgery. Pt states she has tried everything. Has tried diet pills, exercise and dieting. Pt has Focus plane with [facility] and is needing a referral letter from provider. Pt current weight is 250 lbs.    Virtual Visit via Telephone Note  I connected with Gabrielle Richards on 10/31/19 at 10:30 AM EST by telephone and verified that I am speaking with the correct person using two identifiers.  Location: Patient: home Provider: office   I discussed the limitations, risks, security and privacy concerns of performing an evaluation and management service by telephone and the availability of in person appointments. I also discussed with the patient that there may be a patient responsible charge related to this service. The patient expressed understanding and agreed to proceed.   History of Present Illness:    Observations/Objective:   Assessment and Plan:   Follow Up Instructions:    I discussed the assessment and treatment plan with the patient. The patient was provided an opportunity to ask questions and all were answered. The patient agreed with the plan and demonstrated an understanding of the instructions.   The patient was advised to call back or seek an in-person evaluation if the symptoms worsen or if the condition fails to improve as anticipated.  I provided 22 minutes of non-face-to-face time during this encounter.  Despite patient's best efforts in the last several years she has only changed her weight 2 pounds.  Her BMI continues to be quite elevated at 42.9  Patient compliant with Dyazide.  Primarily takes for elements of chronic venous stasis.  Trying to watch her salt intake.      Review of Systems No headache no chest pain some chronic fatigue with work efforts    Objective:   Physical Exam  Virtual        Assessment & Plan:  Impression morbid obesity.  Options discussed at length.  Is already tried focus dietary counseling.  Has tried diet pills.  Has done her own efforts at diet and exercise.  Frustrated ongoing morbid obesity.  Options discussed.  Patient depressed on with bariatric intervention  2.  Chronic venous stasis clinically stable per patient medications refilled

## 2019-12-05 ENCOUNTER — Encounter: Payer: Self-pay | Admitting: Family Medicine

## 2019-12-05 ENCOUNTER — Telehealth: Payer: Self-pay | Admitting: Family Medicine

## 2019-12-05 NOTE — Telephone Encounter (Signed)
Patient dropped off form to be completed by physician in your yellow folder.

## 2019-12-06 NOTE — Telephone Encounter (Signed)
Form for weight loss surgery is in your folder. Phone call was sent back earlier today

## 2019-12-14 NOTE — Telephone Encounter (Signed)
Patient is calling checking on form that was dropped off on 3/16 to see if it was completed.

## 2019-12-18 NOTE — Telephone Encounter (Signed)
Left message to return call 

## 2019-12-18 NOTE — Telephone Encounter (Signed)
Call pt, tell her form AND letter will be ready tomorrow. What is her height??

## 2019-12-19 NOTE — Telephone Encounter (Signed)
Contacted patient. Pt states she is 5'2".   Pt states she picked up some forms today .

## 2020-01-22 ENCOUNTER — Other Ambulatory Visit (HOSPITAL_COMMUNITY): Payer: Self-pay | Admitting: Surgery

## 2020-01-22 ENCOUNTER — Other Ambulatory Visit: Payer: Self-pay | Admitting: Surgery

## 2020-02-06 ENCOUNTER — Ambulatory Visit (HOSPITAL_COMMUNITY)
Admission: RE | Admit: 2020-02-06 | Discharge: 2020-02-06 | Disposition: A | Discharge status: Home / Self Care | Payer: No Typology Code available for payment source | Source: Ambulatory Visit | Attending: Surgery | Admitting: Surgery

## 2020-02-06 ENCOUNTER — Other Ambulatory Visit: Payer: Self-pay

## 2020-02-08 ENCOUNTER — Encounter: Payer: Self-pay | Admitting: Dietician

## 2020-02-08 ENCOUNTER — Other Ambulatory Visit: Payer: Self-pay

## 2020-02-08 ENCOUNTER — Encounter: Payer: No Typology Code available for payment source | Attending: Surgery | Admitting: Dietician

## 2020-02-08 DIAGNOSIS — E669 Obesity, unspecified: Secondary | ICD-10-CM | POA: Diagnosis present

## 2020-02-08 NOTE — Patient Instructions (Addendum)
Begin working through the [employer] discussed today, starting with the following:   Keep sugar & fat in the single digits per serving on food labels  See you at Pre-Op Class!

## 2020-02-08 NOTE — Progress Notes (Signed)
Nutrition Assessment for Bariatric Surgery Medical Nutrition Therapy   Patient was seen on 02/08/2020 for Pre-Operative Nutrition Assessment. Letter of approval faxed to [facility] bariatric surgery program coordinator on 02/08/2020.   Referral stated Supervised Weight Loss (SWL) visits needed: 0  Planned surgery: Sleeve Gastrectomy Pt expectation of surgery: better quality of life, more energy, eat healthier   NUTRITION ASSESSMENT   Anthropometrics  Start weight at [facility]: 254.3 lbs (date: 02/08/2020) Height: 62 in BMI: 46.5 kg/m2     Lifestyle & Dietary Hx Typical meal pattern is 3 meals per day plus a snack. 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 May have cereal, oatmeal, fruit cups, chicken sausage and toast, hamburger with fries, personal pizza, salad, meatloaf with broccoli and mixed veggies for meals. May snack on chips or ice cream. Patient is very motivated to eat well and make necessary lifestyle changes to be successful with surgery.    24-Hr Dietary Recall First Meal: scrambled eggs Snack: - Second Meal: hot dog  Snack: -  Third Meal: chicken wings Snack: ice cream  Beverages: soda, tea, water sometimes   Estimated Energy Needs Calories: 1600 Carbohydrate: 180g Protein: 100g Fat: 53g   NUTRITION DIAGNOSIS  Overweight/obesity ([place]-3.3) related to past poor dietary habits and physical inactivity as evidenced by patient w/ planned Sleeve Gastrectomy surgery following dietary guidelines for continued weight loss.    NUTRITION INTERVENTION  Nutrition counseling (C-1) and education (E-2) to facilitate bariatric surgery goals.  Pre-Op Goals Reviewed with the Patient . Track food and beverage intake (pen and paper, MyFitness Pal, Baritastic app, etc.) . Make healthy food choices while monitoring portion sizes . Consume 3 meals per day or try to eat every 3-5 hours . Avoid  concentrated sugars and fried foods . Keep sugar & fat in the single digits per serving on food labels . Practice CHEWING your food (aim for applesauce consistency) . Practice not drinking 15 minutes before, during, and 30 minutes after each meal and snack . Avoid all carbonated beverages (ex: soda, sparkling beverages)  . Limit caffeinated beverages (ex: coffee, tea, energy drinks) . Avoid all sugar-sweetened beverages (ex: regular soda, sports drinks)  . Avoid alcohol  . Aim for 64-100 ounces of FLUID daily (with at least half of fluid intake being plain water)  . Aim for at least 60-80 grams of PROTEIN daily . Look for a liquid protein source that contains ?15 g protein and ?5 g carbohydrate (ex: shakes, drinks, shots) . Make a list of non-food related activities . Physical activity is an important part of a healthy lifestyle so keep it moving! The goal is to reach 150 minutes of exercise per week, including cardiovascular and weight baring activity.  *Goals that are bolded indicate the pt would like to start working towards these  Handouts Provided Include  . Bariatric Surgery handouts (Nutrition Visits, Pre-Op Goals, Protein Shakes, Vitamins & Minerals)  Learning Style & Readiness for Change Teaching method utilized: Visual & Auditory  Demonstrated degree of understanding via: Teach Back  Barriers to learning/adherence to lifestyle change: None Identified    MONITORING & EVALUATION Dietary intake, weekly physical activity, body weight, and pre-op goals reached at next nutrition visit.   Next Steps Patient is to follow up at [facility] for Pre-Op Class (>2 weeks before surgery) for further nutrition education.

## 2020-03-08 ENCOUNTER — Other Ambulatory Visit: Payer: Self-pay

## 2020-03-08 ENCOUNTER — Encounter: Payer: No Typology Code available for payment source | Attending: Surgery | Admitting: Dietician

## 2020-03-08 VITALS — Ht 62.0 in | Wt 258.0 lb

## 2020-03-08 DIAGNOSIS — E669 Obesity, unspecified: Secondary | ICD-10-CM

## 2020-03-08 DIAGNOSIS — Z841 Family history of disorders of kidney and ureter: Secondary | ICD-10-CM | POA: Insufficient documentation

## 2020-03-08 DIAGNOSIS — Z8261 Family history of arthritis: Secondary | ICD-10-CM | POA: Diagnosis not present

## 2020-03-08 DIAGNOSIS — Z6841 Body Mass Index (BMI) 40.0 and over, adult: Secondary | ICD-10-CM | POA: Diagnosis not present

## 2020-03-08 DIAGNOSIS — Z888 Allergy status to other drugs, medicaments and biological substances status: Secondary | ICD-10-CM | POA: Insufficient documentation

## 2020-03-08 DIAGNOSIS — Z8249 Family history of ischemic heart disease and other diseases of the circulatory system: Secondary | ICD-10-CM | POA: Diagnosis not present

## 2020-03-08 DIAGNOSIS — Z823 Family history of stroke: Secondary | ICD-10-CM | POA: Insufficient documentation

## 2020-03-08 DIAGNOSIS — Z833 Family history of diabetes mellitus: Secondary | ICD-10-CM | POA: Insufficient documentation

## 2020-03-08 DIAGNOSIS — Z91041 Radiographic dye allergy status: Secondary | ICD-10-CM | POA: Insufficient documentation

## 2020-03-08 DIAGNOSIS — Z713 Dietary counseling and surveillance: Secondary | ICD-10-CM | POA: Insufficient documentation

## 2020-03-08 NOTE — Progress Notes (Signed)
Pre-Operative Nutrition Class:  Appt start time: 0900   End time:  1100.  Patient was seen on 03/08/20 for Pre-Operative Bariatric Surgery Education at [facility].   Surgery date: TBD Surgery type: sleeve gastrectomy Start weight at [facility]: 254.3lbs 02/08/20 Weight today: 258.0lbs   InBody  BODY COMP RESULTS    BMI (kg/m^2) 47.2  Fat Mass (lbs) 138.7  Dry Lean Mass (lbs) 31.3  Total Body Water (lbs) 88.0   Samples given per MNT protocol. Patient educated on appropriate usage:       Celebrate Vitamins pack: Lot number Expiration date    Calcium soft chews cherry    1070   05/2021  Calcium soft chews watermelon 0309 01/2021  Calcium soft chews straw-ban cream 1011 03/2021  Calcium soft chews caf mocha 2694 01/2021  Calcium soft chews blackberry 0426 11/2020  Calcium soft chews orange 0275 11/2020  Calcium soft chews caramel 0349 02/2021  Calcium soft chews chocolate 1010 03/2021  Calcium soft chew Isl. fruit 0338 02/2021  Calcium soft chew lemon cream 1032 04/2021    Calcium (tablet) 500 cherry tart [phone] 09/2020  B12 Quick-melt Cherry   350-0938 09/2021       Iron soft chews 58m cherry burst 218299B706/2022  Iron soft chews 649mtwisted citrus 2016967E906/2022  Iron 45 tab grape 00381-01758/2022      Multivitamin soft chews very cherry 21011B6 03/2021  Multivitamin soft chews strawberry 2110258N27/2022  Multi complete 45 watermelon 00778-24234/2022  Multivitamin grape 605 607 2410 01/2021        Premier Beverages:     Vanilla shake 035361440/05/2020         Unjury products:      Chocolate shake 103154M0Q6P4/28/2022    Unflavored powder 126195091/2022  Chocolate powder 523267123/2022  Chicken soup powder 124580991/2022    The following the learning objectives were met by the patient during this course:  Identify Pre-Op Dietary Goals and will begin 2 weeks pre-operatively  Identify appropriate sources  of fluids and proteins   State protein recommendations and appropriate sources pre and post-operatively  Identify Post-Operative Dietary Goals and will follow for 2 weeks post-operatively  Identify appropriate multivitamin and calcium sources  Describe the need for physical activity post-operatively and will follow MD recommendations  State when to call healthcare provider regarding medication questions or post-operative complications  Handouts given during class include:  Pre-Op Bariatric Surgery Diet Handout  Protein Shake Handout  Post-Op Bariatric Surgery Nutrition Handout  [facility] Program Information Flyer  Support Group Information Flyer  [facility] Supp[facility] Price List  Follow-Up Plan: [facility] about 2 weeks post operatively for diet advancement per MD.

## 2020-03-11 ENCOUNTER — Encounter: Payer: Self-pay | Admitting: Family Medicine

## 2020-04-02 ENCOUNTER — Other Ambulatory Visit (HOSPITAL_COMMUNITY): Payer: Self-pay | Admitting: Family Medicine

## 2020-04-02 DIAGNOSIS — Z1231 Encounter for screening mammogram for malignant neoplasm of breast: Secondary | ICD-10-CM

## 2020-04-11 ENCOUNTER — Ambulatory Visit (INDEPENDENT_AMBULATORY_CARE_PROVIDER_SITE_OTHER): Payer: No Typology Code available for payment source | Admitting: Psychology

## 2020-04-11 DIAGNOSIS — F509 Eating disorder, unspecified: Secondary | ICD-10-CM | POA: Diagnosis not present

## 2020-04-12 ENCOUNTER — Ambulatory Visit (HOSPITAL_COMMUNITY): Payer: No Typology Code available for payment source

## 2020-04-15 ENCOUNTER — Ambulatory Visit: Payer: No Typology Code available for payment source | Admitting: Psychology

## 2020-04-18 ENCOUNTER — Ambulatory Visit (HOSPITAL_COMMUNITY)
Admission: RE | Admit: 2020-04-18 | Discharge: 2020-04-18 | Disposition: A | Discharge status: Home / Self Care | Payer: No Typology Code available for payment source | Source: Ambulatory Visit | Attending: Family Medicine | Admitting: Family Medicine

## 2020-04-18 ENCOUNTER — Other Ambulatory Visit: Payer: Self-pay

## 2020-04-18 DIAGNOSIS — Z1231 Encounter for screening mammogram for malignant neoplasm of breast: Secondary | ICD-10-CM | POA: Insufficient documentation

## 2020-05-16 ENCOUNTER — Telehealth: Payer: Self-pay | Admitting: Family Medicine

## 2020-05-16 ENCOUNTER — Other Ambulatory Visit: Payer: Self-pay | Admitting: Family Medicine

## 2020-05-16 MED FILL — TRIAMTERENE/HCTZ 37.5/25 CP: 37.5-25 | 90 days supply | Qty: 90 | Fill #0

## 2020-05-16 NOTE — Telephone Encounter (Signed)
Patient is requesting refill on triamterene-hydrochlorothiazide 37.5 mg called into [facility] she will be out by 8/25 and schedule appointment on 9/8 for medication follow up and esc.

## 2020-05-29 ENCOUNTER — Ambulatory Visit: Payer: No Typology Code available for payment source | Admitting: Family Medicine

## 2020-11-22 DIAGNOSIS — Z1239 Encounter for other screening for malignant neoplasm of breast: Secondary | ICD-10-CM | POA: Diagnosis not present

## 2020-11-22 DIAGNOSIS — Z1211 Encounter for screening for malignant neoplasm of colon: Secondary | ICD-10-CM | POA: Diagnosis not present

## 2020-11-22 DIAGNOSIS — R7301 Impaired fasting glucose: Secondary | ICD-10-CM | POA: Diagnosis not present

## 2020-11-22 DIAGNOSIS — I10 Essential (primary) hypertension: Secondary | ICD-10-CM | POA: Diagnosis not present

## 2020-11-22 DIAGNOSIS — Z124 Encounter for screening for malignant neoplasm of cervix: Secondary | ICD-10-CM | POA: Diagnosis not present

## 2020-11-22 DIAGNOSIS — Z0000 Encounter for general adult medical examination without abnormal findings: Secondary | ICD-10-CM | POA: Diagnosis not present

## 2020-11-22 DIAGNOSIS — Z13220 Encounter for screening for lipoid disorders: Secondary | ICD-10-CM | POA: Diagnosis not present

## 2020-11-22 DIAGNOSIS — J339 Nasal polyp, unspecified: Secondary | ICD-10-CM | POA: Diagnosis not present

## 2021-01-23 DIAGNOSIS — Z1231 Encounter for screening mammogram for malignant neoplasm of breast: Secondary | ICD-10-CM | POA: Diagnosis not present

## 2022-04-29 DIAGNOSIS — R7301 Impaired fasting glucose: Secondary | ICD-10-CM | POA: Insufficient documentation

## 2022-05-13 LAB — COLOGUARD

## 2022-12-31 ENCOUNTER — Ambulatory Visit: Payer: Managed Care, Other (non HMO) | Admitting: Adult Health

## 2022-12-31 ENCOUNTER — Other Ambulatory Visit (HOSPITAL_COMMUNITY)
Admission: RE | Admit: 2022-12-31 | Discharge: 2022-12-31 | Disposition: A | Discharge status: Home / Self Care | Payer: Managed Care, Other (non HMO) | Source: Ambulatory Visit | Attending: Adult Health | Admitting: Adult Health

## 2022-12-31 ENCOUNTER — Encounter: Payer: Self-pay | Admitting: Adult Health

## 2022-12-31 VITALS — BP 138/81 | HR 88 | Ht 62.0 in | Wt 279.5 lb

## 2022-12-31 DIAGNOSIS — Z01419 Encounter for gynecological examination (general) (routine) without abnormal findings: Secondary | ICD-10-CM | POA: Insufficient documentation

## 2022-12-31 DIAGNOSIS — R609 Edema, unspecified: Secondary | ICD-10-CM

## 2022-12-31 DIAGNOSIS — Z1211 Encounter for screening for malignant neoplasm of colon: Secondary | ICD-10-CM | POA: Insufficient documentation

## 2022-12-31 DIAGNOSIS — Z1231 Encounter for screening mammogram for malignant neoplasm of breast: Secondary | ICD-10-CM | POA: Insufficient documentation

## 2022-12-31 LAB — HEMOCCULT GUIAC POC 1CARD (OFFICE): Fecal Occult Blood, POC: NEGATIVE

## 2022-12-31 MED ORDER — PHENTERMINE HCL 30 MG PO CAPS: 30.0000 mg | ORAL_CAPSULE | ORAL | 0 refills | Status: DC

## 2022-12-31 NOTE — Progress Notes (Signed)
Patient ID: LIRIO YEAGLE, female   DOB: 04/21/1971, 52 y.o.   MRN: [id number] History of Present Illness: Adalin is a 52 year old black female, divorced G2P2, in for a well woman gyn exam and pap. She is wanting help with weight and has swelling in BLE and ?knee area.  PCP is Ronny Bacon NP   Current Medications, Allergies, Past Medical History, Past Surgical History, Family History and Social History were reviewed in [employer] record.     Review of Systems: Patient denies any headaches, hearing loss, fatigue, blurred vision, shortness of breath, chest pain, abdominal pain, problems with bowel movements, urination, or intercourse(not active). No joint pain or mood swings.  See HPI for positives   Physical Exam:BP 138/81 (BP Location: Left Arm, Patient Position: Sitting, Cuff Size: Large)   Pulse 88   Ht 5\' 2"  (1.575 m)   Wt 279 lb 8 oz (126.8 kg)   BMI 51.12 kg/m   General:  Well developed, well nourished, no acute distress Skin:  Warm and dry Neck:  Midline trachea, normal thyroid, good ROM, no lymphadenopathy Lungs; Clear to auscultation bilaterally Breast:  No dominant palpable mass, retraction, or nipple discharge Cardiovascular: Regular rate and rhythm Abdomen:  Soft, non tender, no hepatosplenomegaly Pelvic:  External genitalia is normal in appearance, no lesions.  The vagina is normal in appearance. Urethra has no lesions or masses. The cervix is smooth, pap with HR HPV genotyping performed.  Uterus is felt to be normal size, shape, and contour.  No adnexal masses or tenderness noted.Bladder is non tender, no masses felt. Rectal: Good sphincter tone, no polyps, or hemorrhoids felt.  Hemoccult negative. Extremities/musculoskeletal:  No varicosities noted, no clubbing or cyanosis + swelling BLE L>R has fatty tissue at knees R>L Psych:  No mood changes, alert and cooperative,seems happy AA is 1 Fall risk is low    12/31/2022    3:25 PM 02/08/2020     9:37 AM 12/30/2017    8:37 AM  Depression screen PHQ 2/9  Decreased Interest 0 0 0  Down, Depressed, Hopeless 0 0 0  PHQ - 2 Score 0 0 0  Altered sleeping 0    Tired, decreased energy 1    Change in appetite 3    Feeling bad or failure about yourself  0    Trouble concentrating 0    Moving slowly or fidgety/restless 0    Suicidal thoughts 0    PHQ-9 Score 4         12/31/2022    3:25 PM  GAD 7 : Generalized Anxiety Score  Nervous, Anxious, on Edge 0  Control/stop worrying 0  Worry too much - different things 0  Trouble relaxing 0  Restless 0  Easily annoyed or irritable 0  Afraid - awful might happen 0  Total GAD 7 Score 0      Upstream - 12/31/22 1536       Pregnancy Intention Screening   Does the patient want to become pregnant in the next year? No    Does the patient's partner want to become pregnant in the next year? No    Would the patient like to discuss contraceptive options today? No      Contraception Wrap Up   Current Method Female Sterilization    End Method Female Sterilization    Contraception Counseling Provided No            Examination chaperoned by Malachy Mood LPN  Impression and Plan:  1. Encounter for gynecological examination with Papanicolaou smear of cervix Pap sent Pap in 3 years if normal Labs with PCP Physical in 1 year - Cytology - PAP( [place])  2. Encounter for screening fecal occult blood testing Hemoccult was negative  - POCT occult blood stool  3. Morbid obesity Will try phentermine for weight loss Walk every day Increase water Decrease carbs Meds ordered this encounter  Medications   phentermine 30 MG capsule    Sig: Take 1 capsule (30 mg total) by mouth every morning.    Dispense:  30 capsule    Refill:  0    Order Specific Question:   Supervising Provider    Answer:   Lazaro Arms [[id number]]   She declines referral for now to [facility]  Follow up in 4 weeks for weight and BP check   4.  Swelling Weight loss may help  5. Screening mammogram for breast cancer Mammogram scheduled for her 01/06/23 at 7:15 am at [facility] - MM 3D SCREENING MAMMOGRAM BILATERAL BREAST; Future  6. Screening for colorectal cancer Will order cologuard  - Cologuard

## 2023-01-01 ENCOUNTER — Telehealth: Payer: Self-pay | Admitting: *Deleted

## 2023-01-01 ENCOUNTER — Telehealth: Payer: Self-pay

## 2023-01-01 MED ORDER — TRIAMTERENE-HCTZ 37.5-25 MG PO CAPS: 1.0000 | ORAL_CAPSULE | Freq: Every day | ORAL | 1 refills | Status: DC

## 2023-01-01 NOTE — Telephone Encounter (Signed)
Refilled dyazide

## 2023-01-01 NOTE — Addendum Note (Signed)
Addended by: Cyril Mourning A on: 01/01/2023 01:45 PM   Modules accepted: Orders

## 2023-01-01 NOTE — Telephone Encounter (Signed)
Closing encounter. JSY

## 2023-01-01 NOTE — Telephone Encounter (Signed)
Patient called and stated that she was in yesterday and was supposed to be called in  triamterene-hydrochlorothiazide (DYAZIDE) 37.5-25 MG capsule [[id number]] .  But when she went to the pharmacy it was not called in.  Can you please call her and let her know when it has been called in.

## 2023-01-05 LAB — CYTOLOGY - PAP
Comment: NEGATIVE
Diagnosis: NEGATIVE
High risk HPV: NEGATIVE

## 2023-01-06 ENCOUNTER — Ambulatory Visit (HOSPITAL_COMMUNITY): Payer: Managed Care, Other (non HMO)

## 2023-01-18 ENCOUNTER — Ambulatory Visit (HOSPITAL_COMMUNITY)
Admission: RE | Admit: 2023-01-18 | Discharge: 2023-01-18 | Disposition: A | Discharge status: Home / Self Care | Payer: Managed Care, Other (non HMO) | Source: Ambulatory Visit | Attending: Adult Health | Admitting: Adult Health

## 2023-01-18 DIAGNOSIS — Z1231 Encounter for screening mammogram for malignant neoplasm of breast: Secondary | ICD-10-CM | POA: Insufficient documentation

## 2023-01-20 LAB — COLOGUARD: COLOGUARD: NEGATIVE

## 2023-01-28 ENCOUNTER — Ambulatory Visit: Payer: Managed Care, Other (non HMO) | Admitting: Adult Health

## 2023-01-28 ENCOUNTER — Encounter: Payer: Self-pay | Admitting: Adult Health

## 2023-01-28 VITALS — BP 118/79 | HR 77 | Ht 62.0 in | Wt 273.2 lb

## 2023-01-28 DIAGNOSIS — R232 Flushing: Secondary | ICD-10-CM

## 2023-01-28 DIAGNOSIS — Z713 Dietary counseling and surveillance: Secondary | ICD-10-CM | POA: Diagnosis not present

## 2023-01-28 MED ORDER — PHENTERMINE HCL 30 MG PO CAPS: 30.0000 mg | ORAL_CAPSULE | ORAL | 0 refills | Status: DC

## 2023-01-28 NOTE — Progress Notes (Signed)
  Subjective:     Patient ID: Gabrielle Richards, female   DOB: October 01, 1970, 52 y.o.   MRN: [id number]  HPI Gabrielle Richards is a 52 year old black female, divorced, G2P2 in for weight and BP check she started phentermine last month and has lost 6 lbs.     Component Value Date/Time   DIAGPAP  12/31/2022 1538    - Negative for intraepithelial lesion or malignancy (NILM)   HPVHIGH Negative 12/31/2022 1538   ADEQPAP  12/31/2022 1538    Satisfactory for evaluation; transformation zone component PRESENT.   PCP is Roe Rutherford NP  Review of Systems Denies any headaches +hot flashes, 3-4 a day Reviewed past medical,surgical, social and family history. Reviewed medications and allergies.     Objective:   Physical Exam BP 118/79 (BP Location: Right Arm, Patient Position: Sitting, Cuff Size: Large)   Pulse 77   Ht 5\' 2"  (1.575 m)   Wt 273 lb 3.2 oz (123.9 kg)   BMI 49.97 kg/m  lost 6 lbs  Skin warm and dry.  Lungs: clear to ausculation bilaterally. Cardiovascular: regular rate and rhythm.      Upstream - 01/28/23 1603       Pregnancy Intention Screening   Does the patient want to become pregnant in the next year? No    Does the patient's partner want to become pregnant in the next year? No    Would the patient like to discuss contraceptive options today? No      Contraception Wrap Up   Current Method Female Sterilization    End Method Female Sterilization    Contraception Counseling Provided No             Assessment:     1. Weight loss counseling, encounter for Continue weight loss efforts Keep walking every day Will refill phentermine Meds ordered this encounter  Medications   phentermine 30 MG capsule    Sig: Take 1 capsule (30 mg total) by mouth every morning.    Dispense:  30 capsule    Refill:  0    Order Specific Question:   Supervising Provider    Answer:   Despina Hidden, LUTHER H [[id number]]     2. Morbid obesity (HCC)  3. Hot flashes Has 3-4 a day and sweats     Plan:      Follow up in 4 weeks for weight and BP check

## 2023-02-25 ENCOUNTER — Ambulatory Visit: Payer: Managed Care, Other (non HMO) | Admitting: Adult Health

## 2023-03-01 ENCOUNTER — Encounter: Payer: Self-pay | Admitting: Adult Health

## 2023-03-01 ENCOUNTER — Ambulatory Visit (INDEPENDENT_AMBULATORY_CARE_PROVIDER_SITE_OTHER): Payer: Managed Care, Other (non HMO) | Admitting: Adult Health

## 2023-03-01 VITALS — BP 126/81 | HR 76 | Ht 62.0 in | Wt 271.0 lb

## 2023-03-01 DIAGNOSIS — Z713 Dietary counseling and surveillance: Secondary | ICD-10-CM | POA: Diagnosis not present

## 2023-03-01 MED ORDER — PHENTERMINE HCL 30 MG PO CAPS: 30.0000 mg | ORAL_CAPSULE | ORAL | 0 refills | Status: DC

## 2023-03-01 NOTE — Progress Notes (Signed)
  Subjective:     Patient ID: Gabrielle Richards, female   DOB: [birth year], 52 y.o.   MRN: [id number]  HPI Gabrielle Richards is a 52 year old black female,divorced, PM in for a weight and BP check.     Component Value Date/Time   DIAGPAP  12/31/2022 1538    - Negative for intraepithelial lesion or malignancy (NILM)   HPVHIGH Negative 12/31/2022 1538   ADEQPAP  12/31/2022 1538    Satisfactory for evaluation; transformation zone component PRESENT.    PCP is Ronny Bacon NP.  Review of Systems +dry mouth Reviewed past medical,surgical, social and family history. Reviewed medications and allergies.     Objective:   Physical Exam BP 126/81 (BP Location: Right Arm, Patient Position: Sitting, Cuff Size: Large)   Pulse 76   Ht 5\' 2"  (1.575 m)   Wt 271 lb (122.9 kg)   BMI 49.57 kg/m    Lost 2.32 lbs more for total of 8 1/2 lbs. Skin warm and dry.  Lungs: clear to ausculation bilaterally. Cardiovascular: regular rate and rhythm.  Fall risk is low  Upstream - 03/01/23 1140       Pregnancy Intention Screening   Does the patient want to become pregnant in the next year? N/A    Does the patient's partner want to become pregnant in the next year? N/A    Would the patient like to discuss contraceptive options today? N/A      Contraception Wrap Up   Current Method Female Sterilization    End Method Female Sterilization    Contraception Counseling Provided No             Assessment:     1. Weight loss counseling, encounter for Continue weight loss efforts Will refill phentermine Meds ordered this encounter  Medications   phentermine 30 MG capsule    Sig: Take 1 capsule (30 mg total) by mouth every morning.    Dispense:  30 capsule    Refill:  0    Order Specific Question:   Supervising Provider    Answer:   Lazaro Arms [[id number]]   Drink more water   2. Morbid obesity (HCC)     Plan:     Follow up in 4 weeks for weight and BP check

## 2023-03-29 ENCOUNTER — Ambulatory Visit: Payer: Managed Care, Other (non HMO) | Admitting: Adult Health

## 2023-04-12 ENCOUNTER — Ambulatory Visit: Payer: Managed Care, Other (non HMO) | Admitting: Adult Health

## 2023-04-12 ENCOUNTER — Encounter: Payer: Self-pay | Admitting: Adult Health

## 2023-04-12 VITALS — BP 129/86 | HR 76 | Ht 62.0 in | Wt 272.0 lb

## 2023-04-12 DIAGNOSIS — T50905A Adverse effect of unspecified drugs, medicaments and biological substances, initial encounter: Secondary | ICD-10-CM | POA: Diagnosis not present

## 2023-04-12 DIAGNOSIS — Z713 Dietary counseling and surveillance: Secondary | ICD-10-CM

## 2023-04-12 NOTE — Progress Notes (Signed)
  Subjective:     Patient ID: Gabrielle Richards, female   DOB: 10/10/1970, 52 y.o.   MRN: [id number]  HPI Gabrielle Richards is a 52 year old black female,divorced, G2P2 back in follow up on taking phentermine and she stopped, had swelling around left eye and tongue felt lumpy in the mornings.    Component Value Date/Time   DIAGPAP  12/31/2022 1538    - Negative for intraepithelial lesion or malignancy (NILM)   HPVHIGH Negative 12/31/2022 1538   ADEQPAP  12/31/2022 1538    Satisfactory for evaluation; transformation zone component PRESENT.   PCP is C Keatts NP   Review of Systems Had swelling left eye, so stopped phentermine Tongue felt lumpy in mornings Reviewed past medical,surgical, social and family history. Reviewed medications and allergies.     Objective:   Physical Exam BP 129/86 (BP Location: Right Arm, Patient Position: Sitting, Cuff Size: Large)   Pulse 76   Ht 5\' 2"  (1.575 m)   Wt 272 lb (123.4 kg)   BMI 49.75 kg/m     Skin warm and dry. Lungs: clear to ausculation bilaterally. Cardiovascular: regular rate and rhythm.   Upstream - 04/12/23 1403       Pregnancy Intention Screening   Does the patient want to become pregnant in the next year? N/A    Does the patient's partner want to become pregnant in the next year? N/A    Would the patient like to discuss contraceptive options today? N/A      Contraception Wrap Up   Current Method Female Sterilization    End Method Female Sterilization    Contraception Counseling Provided No             Assessment:     1. Weight loss counseling, encounter for Encouraged to try [facility] Continue walking  2. Morbid obesity (HCC)   3. Adverse effect of drug, initial encounter Had swelling around left eye, she felt it was the phentermine, so she stopped it  Tongue felt lumping in mornings     Plan:     Follow up prn

## 2023-08-24 ENCOUNTER — Other Ambulatory Visit: Payer: Self-pay | Admitting: Adult Health

## 2023-12-31 ENCOUNTER — Ambulatory Visit: Admitting: Adult Health

## 2024-01-14 ENCOUNTER — Encounter: Payer: Self-pay | Admitting: Adult Health

## 2024-01-14 ENCOUNTER — Ambulatory Visit: Admitting: Adult Health

## 2024-01-14 VITALS — BP 137/75 | HR 89 | Ht 62.0 in | Wt 272.0 lb

## 2024-01-14 DIAGNOSIS — R609 Edema, unspecified: Secondary | ICD-10-CM

## 2024-01-14 DIAGNOSIS — T7840XA Allergy, unspecified, initial encounter: Secondary | ICD-10-CM | POA: Insufficient documentation

## 2024-01-14 DIAGNOSIS — Z7689 Persons encountering health services in other specified circumstances: Secondary | ICD-10-CM | POA: Diagnosis not present

## 2024-01-14 DIAGNOSIS — I10 Essential (primary) hypertension: Secondary | ICD-10-CM

## 2024-01-14 MED ORDER — LEVOCETIRIZINE DIHYDROCHLORIDE 5 MG PO TABS: 5.0000 mg | ORAL_TABLET | Freq: Every evening | ORAL | 3 refills | Status: AC

## 2024-01-14 MED ORDER — TRIAMTERENE-HCTZ 37.5-25 MG PO CAPS: 1.0000 | ORAL_CAPSULE | Freq: Every day | ORAL | 2 refills | Status: DC

## 2024-01-14 NOTE — Progress Notes (Signed)
  Subjective:     Patient ID: Gabrielle Richards, female   DOB: 1971/06/10, 53 y.o.   MRN: [id number]  HPI Gabrielle Richards is a 53 year old black female,divorced, PM in for refills on BP meds and has complaints of allergies, and she has swelling in left ankle, it goes down at night most of the time, but feels tender at times. Her mom passed about 2 months ago.     Component Value Date/Time   DIAGPAP  12/31/2022 1538    - Negative for intraepithelial lesion or malignancy (NILM)   HPVHIGH Negative 12/31/2022 1538   ADEQPAP  12/31/2022 1538    Satisfactory for evaluation; transformation zone component PRESENT.    Review of Systems +allergies +swelling in left ankle, tender at times, no absolute know injury may have turned it over Reviewed past medical,surgical, social and family history. Reviewed medications and allergies.     Objective:   Physical Exam BP 137/75 (BP Location: Left Arm, Patient Position: Sitting, Cuff Size: Large)   Pulse 89   Ht 5\' 2"  (1.575 m)   Wt 272 lb (123.4 kg)   BMI 49.75 kg/m     Skin warm and dry.  Lungs: clear to ausculation bilaterally. Cardiovascular: regular rate and rhythm. Left ankle is more puffy looking than right, but not pitting.  Fall risk is low  Upstream - 01/14/24 1118       Pregnancy Intention Screening   Does the patient want to become pregnant in the next year? No    Does the patient's partner want to become pregnant in the next year? No    Would the patient like to discuss contraceptive options today? No      Contraception Wrap Up   Current Method Female Sterilization    End Method Female Sterilization    Contraception Counseling Provided No              Assessment:     1. Hypertension, unspecified type (Primary) BP is better Will refill dyazide  Meds ordered this encounter  Medications   triamterene -hydrochlorothiazide  (DYAZIDE) 37.5-25 MG capsule    Sig: Take 1 each (1 capsule total) by mouth daily.    Dispense:  90  capsule    Refill:  2    Supervising Provider:   Evalyn Hillier H [[id number]]   levocetirizine (XYZAL ALLERGY 24HR) 5 MG tablet    Sig: Take 1 tablet (5 mg total) by mouth every evening.    Dispense:  30 tablet    Refill:  3    Supervising Provider:   Evalyn Hillier H [[id number]]   Watch salt and sugars Try to walk daily  Follow up in 6 months for BP check   2. Swelling If does not go down over night let me know, will get cardiology referral  3. Allergy, initial encounter Will rx xyzal 1 at [facility]   4. Encounter to establish care with new doctor Referred to Dr Lydia Sams for PCP, her old PCP does not take her insurance - Ambulatory Referral to Primary Care     Plan:     Follow up in 6 months for BP check and ROS

## 2024-04-10 ENCOUNTER — Ambulatory Visit: Payer: Self-pay | Admitting: Nurse Practitioner

## 2024-05-23 ENCOUNTER — Other Ambulatory Visit (HOSPITAL_COMMUNITY): Payer: Self-pay | Admitting: Adult Health

## 2024-05-23 DIAGNOSIS — Z1231 Encounter for screening mammogram for malignant neoplasm of breast: Secondary | ICD-10-CM

## 2024-06-01 ENCOUNTER — Encounter: Payer: Self-pay | Admitting: Adult Health

## 2024-06-01 ENCOUNTER — Ambulatory Visit (INDEPENDENT_AMBULATORY_CARE_PROVIDER_SITE_OTHER): Admitting: Adult Health

## 2024-06-01 VITALS — BP 113/74 | HR 78 | Ht 62.0 in | Wt 284.0 lb

## 2024-06-01 DIAGNOSIS — Z01419 Encounter for gynecological examination (general) (routine) without abnormal findings: Secondary | ICD-10-CM

## 2024-06-01 DIAGNOSIS — R635 Abnormal weight gain: Secondary | ICD-10-CM | POA: Insufficient documentation

## 2024-06-01 DIAGNOSIS — I10 Essential (primary) hypertension: Secondary | ICD-10-CM | POA: Diagnosis not present

## 2024-06-01 DIAGNOSIS — G479 Sleep disorder, unspecified: Secondary | ICD-10-CM

## 2024-06-01 DIAGNOSIS — R232 Flushing: Secondary | ICD-10-CM | POA: Diagnosis not present

## 2024-06-01 DIAGNOSIS — Z131 Encounter for screening for diabetes mellitus: Secondary | ICD-10-CM

## 2024-06-01 DIAGNOSIS — R61 Generalized hyperhidrosis: Secondary | ICD-10-CM | POA: Diagnosis not present

## 2024-06-01 DIAGNOSIS — Z13220 Encounter for screening for lipoid disorders: Secondary | ICD-10-CM

## 2024-06-01 DIAGNOSIS — R4589 Other symptoms and signs involving emotional state: Secondary | ICD-10-CM

## 2024-06-01 DIAGNOSIS — Z1329 Encounter for screening for other suspected endocrine disorder: Secondary | ICD-10-CM

## 2024-06-01 MED ORDER — ESTRADIOL-LEVONORGESTREL 0.045-0.015 MG/DAY TD PTWK: 1.0000 | MEDICATED_PATCH | TRANSDERMAL | 3 refills | Status: DC

## 2024-06-01 NOTE — Progress Notes (Signed)
 Patient ID: DANIRA NYLANDER, female   DOB: December 07, 1970, 53 y.o.   MRN: [id number] History of Present Illness: Alura is a 53 year old black female,divorced, PM in for a well woman gyn exam. She is complaining of hot flashes, night sweats, waking up at night, mood swings and some weight gain. She wants to discuss estrogen patches.     Component Value Date/Time   DIAGPAP  12/31/2022 1538    - Negative for intraepithelial lesion or malignancy (NILM)   HPVHIGH Negative 12/31/2022 1538   ADEQPAP  12/31/2022 1538    Satisfactory for evaluation; transformation zone component PRESENT.      Current Medications, Allergies, Past Medical History, Past Surgical History, Family History and Social History were reviewed in [employer] record.     Review of Systems: Patient denies any headaches, hearing loss, fatigue, blurred vision, shortness of breath, chest pain, abdominal pain, problems with bowel movements, urination, or intercourse.(Not having sex).  No joint pain. No vaginal dryness Denies MI,stroke,DVT,breast cancer or migraine with aura See HPI for positives    Physical Exam:BP 113/74 (BP Location: Right Arm, Patient Position: Sitting, Cuff Size: Large)   Pulse 78   Ht 5' 2 (1.575 m)   Wt 284 lb (128.8 kg)   BMI 51.94 kg/m   General:  Well developed, well nourished, no acute distress Skin:  Warm and dry Neck:  Midline trachea, normal thyroid, good ROM, no lymphadenopathy Lungs; Clear to auscultation bilaterally Breast:  No dominant palpable mass, retraction, or nipple discharge Cardiovascular: Regular rate and rhythm Abdomen:  Soft, non tender, no hepatosplenomegaly Pelvic:  External genitalia is normal in appearance, no lesions.  The vagina is normal in appearance. Urethra has no lesions or masses. The cervix is smooth.  Uterus is felt to be normal size, shape, and contour.  No adnexal masses or tenderness noted.Bladder is non tender, no masses felt. Rectal:  Deferred Extremities/musculoskeletal:  No swelling or varicosities noted, no clubbing or cyanosis Psych:  No mood changes, alert and cooperative,seems happy AA is 5 Fall risk is low    06/01/2024   11:20 AM 12/31/2022    3:25 PM 02/08/2020    9:37 AM  Depression screen PHQ 2/9  Decreased Interest 0 0 0  Down, Depressed, Hopeless 0 0 0  PHQ - 2 Score 0 0 0  Altered sleeping 0 0   Tired, decreased energy 3 1   Change in appetite 3 3   Feeling bad or failure about yourself  0 0   Trouble concentrating 0 0   Moving slowly or fidgety/restless 0 0   Suicidal thoughts 0 0   PHQ-9 Score 6 4        06/01/2024   11:20 AM 12/31/2022    3:25 PM  GAD 7 : Generalized Anxiety Score  Nervous, Anxious, on Edge 0 0  Control/stop worrying 0 0  Worry too much - different things 0 0  Trouble relaxing 0 0  Restless 0 0  Easily annoyed or irritable 0 0  Afraid - awful might happen 0 0  Total GAD 7 Score 0 0      Upstream - 06/01/24 1128       Pregnancy Intention Screening   Does the patient want to become pregnant in the next year? No    Does the patient's partner want to become pregnant in the next year? No    Would the patient like to discuss contraceptive options today? No  Contraception Wrap Up   Current Method Female Sterilization    End Method Female Sterilization    Contraception Counseling Provided No          Examination chaperoned by Clarita Salt LPN  Impression and Plan: 1. Encounter for well woman exam with routine gynecological exam (Primary) Pap in 2027 Physical in 1 year Will check labs Mammogram was negative 01/18/23 and she has one scheduled 06/02/24 at [facility] Cologuard was negative 2024 - Lipid panel - CBC - Comprehensive metabolic panel with GFR  2. Hypertension, unspecified type BP is good on dyazide 37.5-25 mg 1 daily, has refills   3. Hot flashes Discussed needing progestin with estrogen and she wants to try the patch Will rx climarapro Meds  ordered this encounter  Medications   estradiol -levonorgestrel  (CLIMARAPRO) 0.045-0.015 MG/DAY    Sig: Place 1 patch onto the skin once a week.    Dispense:  4 patch    Refill:  3    Supervising Provider:   JAYNE MINDER H [[id number]]    Follow up in 3 months for ROS 4. Night sweats  5. Weight gain Has gained weight  Will check thyroid  - TSH + free T4 She has appt with Dr Aletha 06/20/24 for new PCP  6. Sleep disturbance Waking up at night   7. Screening cholesterol level - Lipid panel  8. Screening for thyroid disorder - TSH + free T4  9. Screening for diabetes mellitus - Hemoglobin A1c  10. Moody She is moody but not depressed, will see if HRT helps or not

## 2024-06-02 ENCOUNTER — Ambulatory Visit (HOSPITAL_COMMUNITY)
Admission: RE | Admit: 2024-06-02 | Discharge: 2024-06-02 | Disposition: A | Discharge status: Home / Self Care | Source: Ambulatory Visit | Attending: Adult Health | Admitting: Adult Health

## 2024-06-02 DIAGNOSIS — Z1231 Encounter for screening mammogram for malignant neoplasm of breast: Secondary | ICD-10-CM | POA: Insufficient documentation

## 2024-06-02 LAB — LIPID PANEL
Chol/HDL Ratio: 4.3 ratio (ref 0.0–4.4)
Cholesterol, Total: 262 mg/dL — ABNORMAL HIGH (ref 100–199)
HDL: 61 mg/dL (ref >39)
LDL Chol Calc (NIH): 179 mg/dL — ABNORMAL HIGH (ref 0–99)
Triglycerides: 122 mg/dL (ref 0–149)
VLDL Cholesterol Cal: 22 mg/dL (ref 5–40)

## 2024-06-02 LAB — COMPREHENSIVE METABOLIC PANEL WITH GFR
ALT: 9 IU/L (ref 0–32)
AST: 17 IU/L (ref 0–40)
Albumin: 4.1 g/dL (ref 3.8–4.9)
Alkaline Phosphatase: 87 IU/L (ref 44–121)
BUN/Creatinine Ratio: 16 (ref 9–23)
BUN: 15 mg/dL (ref 6–24)
Bilirubin Total: 0.3 mg/dL (ref 0.0–1.2)
CO2: 22 mmol/L (ref 20–29)
Calcium: 9.8 mg/dL (ref 8.7–10.2)
Chloride: 99 mmol/L (ref 96–106)
Creatinine, Ser: 0.94 mg/dL (ref 0.57–1.00)
Globulin, Total: 2.9 g/dL (ref 1.5–4.5)
Glucose: 80 mg/dL (ref 70–99)
Potassium: 4.1 mmol/L (ref 3.5–5.2)
Sodium: 136 mmol/L (ref 134–144)
Total Protein: 7 g/dL (ref 6.0–8.5)
eGFR: 72 mL/min/1.73 (ref >59)

## 2024-06-02 LAB — CBC
Hematocrit: 46.5 % (ref 34.0–46.6)
Hemoglobin: 14.6 g/dL (ref 11.1–15.9)
MCH: 28.9 pg (ref 26.6–33.0)
MCHC: 31.4 g/dL — ABNORMAL LOW (ref 31.5–35.7)
MCV: 92 fL (ref 79–97)
Platelets: 333 x10E3/uL (ref 150–450)
RBC: 5.05 x10E6/uL (ref 3.77–5.28)
RDW: 13.1 % (ref 11.7–15.4)
WBC: 10.1 x10E3/uL (ref 3.4–10.8)

## 2024-06-02 LAB — HEMOGLOBIN A1C
Est. average glucose Bld gHb Est-mCnc: 128 mg/dL
Hgb A1c MFr Bld: 6.1 % — ABNORMAL HIGH (ref 4.8–5.6)

## 2024-06-02 LAB — TSH+FREE T4
Free T4: 1.04 ng/dL (ref 0.82–1.77)
TSH: 1.98 u[IU]/mL (ref 0.450–4.500)

## 2024-06-05 ENCOUNTER — Ambulatory Visit: Payer: Self-pay | Admitting: Adult Health

## 2024-06-06 ENCOUNTER — Ambulatory Visit: Payer: Self-pay | Admitting: Adult Health

## 2024-06-20 ENCOUNTER — Encounter: Payer: Self-pay | Admitting: Family Medicine

## 2024-06-20 ENCOUNTER — Ambulatory Visit: Admitting: Family Medicine

## 2024-06-20 VITALS — BP 128/88 | HR 81 | Temp 98.3°F | Ht 62.0 in | Wt 279.1 lb

## 2024-06-20 DIAGNOSIS — Z23 Encounter for immunization: Secondary | ICD-10-CM

## 2024-06-20 DIAGNOSIS — E785 Hyperlipidemia, unspecified: Secondary | ICD-10-CM

## 2024-06-20 DIAGNOSIS — I10 Essential (primary) hypertension: Secondary | ICD-10-CM

## 2024-06-20 DIAGNOSIS — R7303 Prediabetes: Secondary | ICD-10-CM

## 2024-06-20 DIAGNOSIS — Z7689 Persons encountering health services in other specified circumstances: Secondary | ICD-10-CM

## 2024-06-20 NOTE — Progress Notes (Signed)
 Patient Office Visit  Assessment & Plan:  Encounter to establish care  Immunization due -     Varicella-zoster vaccine IM  Hypertension, unspecified type  Hyperlipidemia, unspecified hyperlipidemia type  Need for Tdap vaccination -     Tdap vaccine greater than or equal to 55yo IM  Prediabetes   Assessment and Plan    Obesity Obesity with weight fluctuations. Surgical options advised against due to lack of significant health issues. Interest in [facility] healthy weight program noted. - Discuss weight management options, including lifestyle changes and potential programs through [facility]  Prediabetes Prediabetes with A1c of 6.1, previously 6.3 in 2023. Borderline condition requiring monitoring. Discussed importance of lifestyle modifications to prevent progression to type 2 diabetes. - Encourage lifestyle modifications, including diet and exercise.  Hyperlipidemia Hyperlipidemia with elevated cholesterol levels and family history. 10-year cardiovascular risk score is 5.7, borderline. Discussed calcium score test for plaque assessment and potential need for cholesterol medication. - Provide information on calcium score test for further evaluation. - Encourage a Mediterranean diet and healthy lifestyle.  Lower extremity edema Lower extremity edema primarily in the ankles. Managed with triamterene  hydrochlorothiazide . - Continue triamterene  hydrochlorothiazide  for edema management.  Menopausal symptoms Menopausal symptoms include hot flashes and sleep disturbances. Hormone patch containing estrogen and progesterone provides some relief. Reports difficulty with patch application. - Assist with hormone patch application. - Continue current hormone therapy.      Test results were reviewed and analyzed as part of the medical decision making of this visit.  Previous notes from [facility] and notes during the office visit.  Reviewed previous lab work  that was done.  Discussed the calcium score patient will think about this and let us  know if she would like to do this.  This will help us  with risk stratification. Patient does not think her insurance covers Wegovy or Zepbound for weight loss.  Recommend healthy diet i.e mediterranean/DASH diet, consistent exercise - 30 minutes 5 day per week, and gradual weight loss.  Return in about 3 months (around 09/19/2024), or if symptoms worsen or fail to improve, for physical.   Subjective:    Patient ID: Gabrielle Richards, female    DOB: 01-14-1971  Age: 53 y.o. MRN: [id number]  Chief Complaint  Patient presents with   Medical Management of Chronic Issues   Establish Care    HPI Discussed the use of AI scribe software for clinical note transcription with the patient, who gave verbal consent to proceed.  History of Present Illness        Gabrielle Richards is a 53 year old female who presents for establishing care and medication management.  She has a history of prediabetes with her most recent A1c being 6.1, and a previous reading of 6.3 in 2023. She has never been diagnosed with type 2 diabetes, but has been borderline for some time. There is a family history of diabetes, with two brothers having diabetes and her mother having had type 2 diabetes.  She is currently taking triamterene  hydrochlorothiazide  for swelling in her ankles, not for hypertension, as she has never had a problem with high blood pressure.  she does have diagnosis of hypertension in her chart. Diuretic medication causes frequent urination. There is some confusion about the diagnosis of hypertension, but the medication helps with both swelling and blood pressure control.  She has elevated cholesterol levels but has never been on medication. She attributes this to inheritance, as there is a  family history of cholesterol issues. There is also a family history of heart disease and prostate cancer in her brother.  She struggles with  maintaining a healthy diet, often consuming fast food and having difficulty with weight management. She has attempted to join a weight management program but was advised against surgery due to lack of health issues. She is interested in lifestyle changes.  She is on hormone replacement therapy with a patch for hot flashes and sleep disturbances, which has helped her symptoms. She has had a uterine ablation in the past to stop her periods. She does see OB-GYN at [facility]. patient has UTD pap smears   She is up to date with her colonoscopy and mammogram screenings, and has recently received a tetanus and shingles vaccine. She has not had the pneumonia or shingles vaccines before. She does not smoke or use recreational drugs, and she drinks alcohol occasionally. No history of obstructive sleep apnea. Physical Exam HEENT: Cerumen in right ear Results LABS A1c: 6.1 (05/2024) A1c: 6.3 (2023)  RADIOLOGY Mammogram: Normal (06/02/2024) Assessment & Plan Obesity Obesity with weight fluctuations. Surgical options advised against due to lack of significant health issues. Interest in [facility] healthy weight program noted. - Discuss weight management options, including lifestyle changes and potential programs through [facility].  Prediabetes Prediabetes with A1c of 6.1, previously 6.3 in 2023. Borderline condition requiring monitoring. Discussed importance of lifestyle modifications to prevent progression to type 2 diabetes. - Encourage lifestyle modifications, including diet and exercise.  Hyperlipidemia Hyperlipidemia with elevated cholesterol levels and family history. 10-year cardiovascular risk score is 5.7, borderline. Discussed calcium score test for plaque assessment and potential need for cholesterol medication. - Provide information on calcium score test for further evaluation/risk stratification  - Encourage a Mediterranean diet and healthy lifestyle.  Lower extremity edema Lower  extremity edema primarily in the ankles. Managed with triamterene  hydrochlorothiazide . - Continue triamterene  hydrochlorothiazide  for edema management.  Menopausal symptoms Menopausal symptoms include hot flashes and sleep disturbances. Hormone patch containing estrogen and progesterone provides some relief. Reports difficulty with patch application. - Assist with hormone patch application. - Continue current hormone therapy.    The 10-year ASCVD risk score (Arnett DK, et al., 2019) is: 5.7%  Past Medical History:  Diagnosis Date   Bulging lumbar disc    Chlamydia 04/05/2014   History of abnormal cervical Pap smear 03/06/2014   History of chlamydia    Hyperlipidemia    Hypertension    Menorrhagia with irregular cycle 03/14/2015   Swelling 03/06/2014   Vaginal Pap smear, abnormal    Past Surgical History:  Procedure Laterality Date   DILITATION & CURRETTAGE/HYSTROSCOPY WITH THERMACHOICE ABLATION N/A 11/21/2012   Procedure: DILATATION & CURETTAGE/HYSTEROSCOPY WITH THERMACHOICE ABLATION;  Surgeon: Vonn VEAR Inch, MD;  Location: [facility];  Service: Gynecology;  Laterality: N/A;  Total Therapy Time: 9:22   Temperature:87              Balloon: 16 ml's in and 16 ml's out       TUBAL LIGATION  1997   [facility]   Social History   Tobacco Use   Smoking status: Never   Smokeless tobacco: Never  Vaping Use   Vaping status: Never Used  Substance Use Topics   Alcohol use: Yes    Comment: socially   Drug use: No   Family History  Problem Relation Age of Onset   Diabetes Mother    Other Mother        sepsis  Diabetes Brother    Hyperlipidemia Brother    Diabetes Mellitus II Brother    Hyperlipidemia Brother    Prostate cancer Brother    Allergies  Allergen Reactions   Contrave  [Naltrexone -Bupropion  Hcl Er] Hives   Phentermine  Other (See Comments)    Swelling around right eye; tongue felt lumpy    ROS    Objective:    BP 128/88   Pulse 81   Temp 98.3 F (36.8  C)   Ht 5' 2 (1.575 m)   Wt 279 lb 2 oz (126.6 kg)   SpO2 99%   BMI 51.05 kg/m  BP Readings from Last 3 Encounters:  06/20/24 128/88  06/01/24 113/74  01/14/24 137/75   Wt Readings from Last 3 Encounters:  06/20/24 279 lb 2 oz (126.6 kg)  06/01/24 284 lb (128.8 kg)  01/14/24 272 lb (123.4 kg)    Physical Exam Vitals and nursing note reviewed.  Constitutional:      General: She is not in acute distress.    Appearance: Normal appearance.  HENT:     Head: Normocephalic.     Right Ear: Tympanic membrane, ear canal and external ear normal.     Left Ear: Tympanic membrane, ear canal and external ear normal.  Eyes:     Extraocular Movements: Extraocular movements intact.     Conjunctiva/sclera: Conjunctivae normal.     Pupils: Pupils are equal, round, and reactive to light.  Cardiovascular:     Rate and Rhythm: Normal rate and regular rhythm.     Heart sounds: Normal heart sounds.  Pulmonary:     Effort: Pulmonary effort is normal.     Breath sounds: Normal breath sounds.  Musculoskeletal:     Right lower leg: No edema.     Left lower leg: No edema.  Neurological:     General: No focal deficit present.     Mental Status: She is alert and oriented to person, place, and time.  Psychiatric:        Mood and Affect: Mood normal.        Behavior: Behavior normal.        Thought Content: Thought content normal.        Judgment: Judgment normal.      No results found for any visits on 06/20/24.

## 2024-07-28 ENCOUNTER — Other Ambulatory Visit: Payer: Self-pay | Admitting: Adult Health

## 2024-08-28 ENCOUNTER — Ambulatory Visit: Admitting: Adult Health

## 2024-08-31 ENCOUNTER — Ambulatory Visit: Admitting: Adult Health

## 2024-09-20 ENCOUNTER — Ambulatory Visit (INDEPENDENT_AMBULATORY_CARE_PROVIDER_SITE_OTHER): Admitting: Family Medicine

## 2024-09-20 ENCOUNTER — Encounter: Payer: Self-pay | Admitting: Family Medicine

## 2024-09-20 VITALS — BP 102/84 | HR 79 | Temp 98.9°F | Ht 62.0 in | Wt 277.0 lb

## 2024-09-20 DIAGNOSIS — Z0001 Encounter for general adult medical examination with abnormal findings: Secondary | ICD-10-CM

## 2024-09-20 DIAGNOSIS — Z6841 Body Mass Index (BMI) 40.0 and over, adult: Secondary | ICD-10-CM | POA: Diagnosis not present

## 2024-09-20 DIAGNOSIS — I10 Essential (primary) hypertension: Secondary | ICD-10-CM | POA: Diagnosis not present

## 2024-09-20 DIAGNOSIS — Z23 Encounter for immunization: Secondary | ICD-10-CM

## 2024-09-20 DIAGNOSIS — Z0000 Encounter for general adult medical examination without abnormal findings: Secondary | ICD-10-CM

## 2024-09-20 DIAGNOSIS — L209 Atopic dermatitis, unspecified: Secondary | ICD-10-CM

## 2024-09-20 MED ORDER — TRIAMCINOLONE ACETONIDE 0.1 % EX CREA: 1.0000 | TOPICAL_CREAM | Freq: Two times a day (BID) | CUTANEOUS | 1 refills | Status: AC

## 2024-09-20 MED ORDER — TRIAMTERENE-HCTZ 37.5-25 MG PO CAPS: 1.0000 | ORAL_CAPSULE | Freq: Every day | ORAL | 2 refills | Status: AC

## 2024-09-20 NOTE — Progress Notes (Signed)
 "  Complete physical exam  Assessment & Plan:    Routine Health Maintenance and Physical Exam Discussed health benefits of physical activity, and encouraged her to engage in regular exercise appropriate for her age and condition.  Preventative health care  Immunization due -     Varicella-zoster vaccine IM -     Pneumococcal conjugate vaccine 20-valent  Hypertension, unspecified type -     Triamterene -HCTZ; Take 1 each (1 capsule total) by mouth daily.  Dispense: 90 capsule; Refill: 2  Atopic dermatitis, unspecified type -     Triamcinolone  Acetonide; Apply 1 Application topically 2 (two) times daily. Apply to dry skin BID as needed  Dispense: 60 g; Refill: 1  BMI 50.0-59.9, adult (HCC)  Recommend healthy diet i.e mediterranean/DASH diet, consistent exercise - 30 minutes 5 day per week, and gradual weight loss. Will recheck labs next time. Consider EKG next time  Return in about 3 months (around 12/19/2024), or if symptoms worsen or fail to improve.        Subjective:  Patient ID: Gabrielle Richards, female    DOB: 1970/12/03  Age: 53 y.o. MRN: [id number] Chief Complaint  Patient presents with   Annual Exam    Would like cream to be prescribed for dry skin.    Gabrielle Richards is a 53 y.o. female who presents today for a complete physical exam. Colon cancer screening-UTD, Cologuard negative done 2024 Tetanus- UTD Shingrix -2nd one today History of Present Illness Gabrielle Richards is a 53 year old female who presents for immunizations and follow-up on her health maintenance/complete physical exam  She previously received a tetanus shot and the first shingles vaccine. Her Cologuard test is up to date for three years, was done last year, and her last mammogram was in September.  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 No chest pain or other concerning symptoms. Patient does not have  heart disease and no FH of premature CAD   She has postmenopausal spotting after starting an estrogen patch, which has alleviated her hot flashes. She underwent uterine ablation years ago and is unsure of her menopausal status but thinks she is going through menopause now. Patient has not reached out to her OB-GYN re postmenopausal spotitng  She has a history of prediabetes, with recent blood work in September showing elevated glucose levels. She is considering dietary changes, including the Mediterranean and DASH diets, to manage her weight and blood sugar.  Her family history includes diabetes, hypertension, and hyperlipidemia among her brothers, with one brother having a history of prostate cancer. Her mother passed away at 77, and her aunt, who had dementia, passed away last year.  Physical Exam SKIN: Mild dry skin on neck  Results Labs Blood glucose (September 2025): Impaired fasting glucose consistent with prediabetes  Radiology Mammogram (September 2025): Within normal limits  Assessment and Plan Woman's Wellness Visit Routine wellness visit focused on healthy lifestyle maintenance. Recent weight loss noted. Immunizations current. Mammogram completed. Family history of dementia and prostate cancer discussed. Emotional well-being addressed. - Continue Mediterranean and Dash diets. - Repeat blood work in three months. - Schedule follow-up in three months.  Essential hypertension Blood pressure well-controlled with current medication. - Continue current antihypertensive medication regimen.  Prediabetes Previous blood work indicated prediabetes. No immediate changes in management. - Repeat blood work in three months.  Postmenopausal bleeding Postmenopausal bleeding after starting hormone patch requires evaluation. - Follow up  with gynecologist regarding postmenopausal bleeding. - Communicate with gynecologist via MyChart.  Menopausal symptoms Menopausal symptoms managed with  hormone patch. Hormone patch effective but expensive. - Continue hormone patch therapy.  Dry skin of neck Dry skin on neck, sometimes itchy. Importance of moisturizing discussed. - Use moisturizers like Cetaphil, Nivea, or Lubriderm. - Apply moisturizer immediately after showering. Will sent steroid cream to pharmacy The 10-year ASCVD risk score (Arnett DK, et al., 2019) is: 2.6%   Values used to calculate the score:     Age: 53 years     Clinically relevant sex: Female     Is Non-Hispanic African American: Yes     Diabetic: No     Tobacco smoker: No     Systolic Blood Pressure: 102 mmHg     Is BP treated: Yes     HDL Cholesterol: 61 mg/dL     Total Cholesterol: 262 mg/dL  Health Maintenance  Topic Date Due   Hepatitis C Screening  Never done   Hepatitis B Vaccine (1 of 3 - 19+ 3-dose series) Never done   COVID-19 Vaccine (6 - 2025-26 season) 05/22/2024   Flu Shot  12/19/2024*   HIV Screening  09/21/2048*   Breast Cancer Screening  06/02/2025   Cologuard (Stool DNA test)  01/11/2026   Pap with HPV screening  12/31/2027   DTaP/Tdap/Td vaccine (3 - Td or Tdap) 06/20/2034   Pneumococcal Vaccine for age over 39  Completed   Zoster (Shingles) Vaccine  Completed   HPV Vaccine  Aged Out   Meningitis B Vaccine  Aged Out  *Topic was postponed. The date shown is not the original due date.    Most recent fall risk assessment:    06/01/2024   11:28 AM  Fall Risk   Falls in the past year? 0  Number falls in past yr: 0  Injury with Fall? 0      Data saved with a previous flowsheet row definition     Most recent depression screenings:    06/01/2024   11:20 AM 12/31/2022    3:25 PM  PHQ 2/9 Scores  PHQ - 2 Score 0 0  PHQ- 9 Score 6  4      Data saved with a previous flowsheet row definition      The 10-year ASCVD risk score (Arnett DK, et al., 2019) is: 2.6%   Values used to calculate the score:     Age: 53 years     Clinically relevant sex: Female     Is Non-Hispanic  African American: Yes     Diabetic: No     Tobacco smoker: No     Systolic Blood Pressure: 102 mmHg     Is BP treated: Yes     HDL Cholesterol: 61 mg/dL     Total Cholesterol: 262 mg/dL  Past Surgical History:  Procedure Laterality Date   DILITATION & CURRETTAGE/HYSTROSCOPY WITH THERMACHOICE ABLATION N/A 11/21/2012   Procedure: DILATATION & CURETTAGE/HYSTEROSCOPY WITH THERMACHOICE ABLATION;  Surgeon: Vonn VEAR Inch, MD;  Location: [facility];  Service: Gynecology;  Laterality: N/A;  Total Therapy Time: 9:22   Temperature:87              Balloon: 16 ml's in and 16 ml's out       TUBAL LIGATION  1997   [facility]   Social History[1] Social History   Socioeconomic History   Marital status: Divorced    Spouse name: Not on file   Number of children:  Not on file   Years of education: Not on file   Highest education level: Not on file  Occupational History   Not on file  Tobacco Use   Smoking status: Never   Smokeless tobacco: Never  Vaping Use   Vaping status: Never Used  Substance and Sexual Activity   Alcohol use: Yes    Comment: socially   Drug use: No   Sexual activity: Not Currently    Birth control/protection: Surgical    Comment: tubal & ablation  Other Topics Concern   Not on file  Social History Narrative   Not on file   Social Drivers of Health   Tobacco Use: Low Risk (09/20/2024)   Patient History    Smoking Tobacco Use: Never    Smokeless Tobacco Use: Never    Passive Exposure: Not on file  Financial Resource Strain: Low Risk (06/01/2024)   Overall Financial Resource Strain (CARDIA)    Difficulty of Paying Living Expenses: Not very hard  Food Insecurity: No Food Insecurity (06/01/2024)   Epic    Worried About Programme Researcher, Broadcasting/film/video in the Last Year: Never true    Ran Out of Food in the Last Year: Never true  Transportation Needs: No Transportation Needs (06/01/2024)   Epic    Lack of Transportation (Medical): No    Lack of Transportation (Non-Medical):  No  Physical Activity: Insufficiently Active (06/01/2024)   Exercise Vital Sign    Days of Exercise per Week: 1 day    Minutes of Exercise per Session: 10 min  Stress: No Stress Concern Present (06/01/2024)   [employer] of Occupational Health - Occupational Stress Questionnaire    Feeling of Stress: Not at all  Social Connections: Moderately Isolated (06/01/2024)   Social Connection and Isolation Panel    Frequency of Communication with Friends and Family: More than three times a week    Frequency of Social Gatherings with Friends and Family: Once a week    Attends Religious Services: 1 to 4 times per year    Active Member of [employer] or Organizations: No    Attends Banker Meetings: Never    Marital Status: Divorced  Catering Manager Violence: Not At Risk (06/01/2024)   Epic    Fear of Current or Ex-Partner: No    Emotionally Abused: No    Physically Abused: No    Sexually Abused: No  Depression (PHQ2-9): Medium Risk (06/01/2024)   Depression (PHQ2-9)    PHQ-2 Score: 6  Alcohol Screen: Low Risk (06/01/2024)   Alcohol Screen    Last Alcohol Screening Score (AUDIT): 5  Housing: Low Risk (06/01/2024)   Epic    Unable to Pay for Housing in the Last Year: No    Number of Times Moved in the Last Year: 0    Homeless in the Last Year: No  Utilities: Not At Risk (06/01/2024)   Epic    Threatened with loss of utilities: No  Health Literacy: Not on file   Family History  Problem Relation Age of Onset   Diabetes Mother    Other Mother        sepsis   Diabetes Brother    Hyperlipidemia Brother    Diabetes Mellitus II Brother    Hyperlipidemia Brother    Prostate cancer Brother    Dementia Maternal Aunt    Allergies[2]   Patient Care Team: Aletha Bene, MD as PCP - General (Family Medicine)   Show/hide medication list[3]  ROS  Objective:    BP 102/84   Pulse 79   Temp 98.9 F (37.2 C)   Ht 5' 2 (1.575 m)   Wt 277 lb (125.6 kg)   SpO2 97%   BMI  50.66 kg/m  BP Readings from Last 3 Encounters:  09/20/24 102/84  06/20/24 128/88  06/01/24 113/74   Wt Readings from Last 3 Encounters:  09/20/24 277 lb (125.6 kg)  06/20/24 279 lb 2 oz (126.6 kg)  06/01/24 284 lb (128.8 kg)    Physical Exam Vitals and nursing note reviewed.  Constitutional:      General: She is not in acute distress.    Appearance: Normal appearance.  HENT:     Head: Normocephalic.     Right Ear: Tympanic membrane, ear canal and external ear normal.     Left Ear: Tympanic membrane, ear canal and external ear normal.     Mouth/Throat:     Pharynx: No oropharyngeal exudate or posterior oropharyngeal erythema.  Eyes:     Extraocular Movements: Extraocular movements intact.     Conjunctiva/sclera: Conjunctivae normal.     Pupils: Pupils are equal, round, and reactive to light.  Cardiovascular:     Rate and Rhythm: Normal rate and regular rhythm.     Heart sounds: Normal heart sounds.  Pulmonary:     Effort: Pulmonary effort is normal.     Breath sounds: Normal breath sounds. No wheezing.  Chest:  Breasts:    Right: No inverted nipple, mass, nipple discharge, skin change or tenderness.     Left: No inverted nipple, mass, nipple discharge, skin change or tenderness.  Abdominal:     General: Bowel sounds are normal.     Tenderness: There is no abdominal tenderness. There is no guarding or rebound.  Musculoskeletal:        General: Normal range of motion.     Cervical back: No tenderness.     Right lower leg: No edema.     Left lower leg: No edema.  Lymphadenopathy:     Upper Body:     Right upper body: No axillary adenopathy.     Left upper body: No axillary adenopathy.  Skin:    Findings: No lesion or rash.     Comments: Hyperpigmentation noted over posterior neck area due to dry skin  Has birth mark over left forearm  Neurological:     General: No focal deficit present.     Mental Status: She is alert and oriented to person, place, and time.  Mental status is at baseline.     Cranial Nerves: Cranial nerves 2-12 are intact. No cranial nerve deficit.     Sensory: No sensory deficit.     Motor: Motor function is intact. No weakness.     Coordination: Coordination is intact. Romberg sign negative. Coordination normal. Finger-Nose-Finger Test normal.     Gait: Gait normal.     Deep Tendon Reflexes: Reflexes are normal and symmetric. Reflexes normal.  Psychiatric:        Mood and Affect: Mood normal.        Behavior: Behavior normal.        Thought Content: Thought content normal.        Judgment: Judgment normal.      No results found for any visits on 09/20/24.      Connie Emperor, MD      [1]  Social History Tobacco Use   Smoking status: Never   Smokeless tobacco: Never  Vaping Use  Vaping status: Never Used  Substance Use Topics   Alcohol use: Yes    Comment: socially   Drug use: No  [2]  Allergies Allergen Reactions   Contrave  [Naltrexone -Bupropion  Hcl Er] Hives   Phentermine  Other (See Comments)    Swelling around right eye; tongue felt lumpy  [3]  Outpatient Medications Prior to Visit  Medication Sig   estradiol -levonorgestrel  (CLIMARAPRO) 0.045-0.015 MG/DAY Place 1 patch onto the skin once a week.   levocetirizine (XYZAL  ALLERGY 24HR) 5 MG tablet Take 1 tablet (5 mg total) by mouth every evening. (Patient taking differently: Take 5 mg by mouth as needed for allergies.)   [DISCONTINUED] triamterene -hydrochlorothiazide  (DYAZIDE) 37.5-25 MG capsule TAKE 1 CAPSULE BY MOUTH DAILY   No facility-administered medications prior to visit.   "

## 2024-10-06 ENCOUNTER — Encounter: Payer: Self-pay | Admitting: Adult Health

## 2024-10-06 ENCOUNTER — Ambulatory Visit: Admitting: Adult Health

## 2024-10-06 VITALS — BP 125/79 | HR 72 | Ht 62.0 in | Wt 282.0 lb

## 2024-10-06 DIAGNOSIS — R232 Flushing: Secondary | ICD-10-CM

## 2024-10-06 DIAGNOSIS — N950 Postmenopausal bleeding: Secondary | ICD-10-CM | POA: Diagnosis not present

## 2024-10-06 DIAGNOSIS — R4589 Other symptoms and signs involving emotional state: Secondary | ICD-10-CM

## 2024-10-06 DIAGNOSIS — G479 Sleep disorder, unspecified: Secondary | ICD-10-CM

## 2024-10-06 NOTE — Progress Notes (Signed)
 " Subjective:     Patient ID: Gabrielle Richards, female   DOB: 04/29/1971, 54 y.o.   MRN: [id number]  HPI Gabrielle Richards is a 54 year old black female, divorced, PM in complaining of having PMB(spotting), so she stopped the patch, has hot flashes but not as bad, and sleeping better and not as moody.    Component Value Date/Time   DIAGPAP  12/31/2022 1538    - Negative for intraepithelial lesion or malignancy (NILM)   HPVHIGH Negative 12/31/2022 1538   ADEQPAP  12/31/2022 1538    Satisfactory for evaluation; transformation zone component PRESENT.   PCP is Dr Aletha  Review of Systems +PMB(spotting) Hot flashes better Sleeping better Not as moody  Reviewed past medical,surgical, social and family history. Reviewed medications and allergies.     Objective:   Physical Exam BP 125/79 (BP Location: Right Arm, Patient Position: Sitting, Cuff Size: Large)   Pulse 72   Ht 5' 2 (1.575 m)   Wt 282 lb (127.9 kg)   BMI 51.58 kg/m   Skin warm and dry.Pelvic: external genitalia is normal in appearance no lesions, vagina is pale,urethra has no lesions or masses noted, cervix:smooth, uterus: normal size, shape and contour, non tender, no masses felt, adnexa: no masses or tenderness noted. Bladder is non tender and no masses felt.    Fall risk is low    10/06/2024    9:04 AM 06/01/2024   11:20 AM 12/31/2022    3:25 PM  Depression screen PHQ 2/9  Decreased Interest 0 0 0  Down, Depressed, Hopeless 0 0 0  PHQ - 2 Score 0 0 0  Altered sleeping 0 0 0  Tired, decreased energy 0 3 1  Change in appetite 0 3 3  Feeling bad or failure about yourself  0 0 0  Trouble concentrating 0 0 0  Moving slowly or fidgety/restless 0 0 0  Suicidal thoughts 0 0 0  PHQ-9 Score 0 6  4      Data saved with a previous flowsheet row definition       10/06/2024    9:05 AM 06/01/2024   11:20 AM 12/31/2022    3:25 PM  GAD 7 : Generalized Anxiety Score  Nervous, Anxious, on Edge 0 0 0  Control/stop worrying 0 0 0  Worry  too much - different things 0 0 0  Trouble relaxing 0 0 0  Restless 0 0 0  Easily annoyed or irritable 0 0 0  Afraid - awful might happen 0 0 0  Total GAD 7 Score 0 0 0      Upstream - 10/06/24 0902       Pregnancy Intention Screening   Does the patient want to become pregnant in the next year? N/A    Does the patient's partner want to become pregnant in the next year? N/A    Would the patient like to discuss contraceptive options today? N/A      Contraception Wrap Up   Current Method Abstinence;Female Sterilization    End Method Abstinence;Female Sterilization    Contraception Counseling Provided No         Examination chaperoned by Clarita Salt LPN  Assessment:     1. PMB (postmenopausal bleeding) (Primary) Has spotting was on ClimaraPro so she stopped it  Will get pelvic US  10/17/24 to assess uterus and ovaries She is aware that if endometrium is thickened will need biopsy  - US  PELVIC COMPLETE WITH TRANSVAGINAL; Future  2. Hot flashes better  3. Sleep disturbance better  4. Moody Better     Plan:      Return in 11 days for pelvic US  in office     "

## 2024-10-17 ENCOUNTER — Ambulatory Visit: Payer: Self-pay | Admitting: Adult Health

## 2024-10-17 ENCOUNTER — Ambulatory Visit: Admitting: Radiology

## 2024-10-17 DIAGNOSIS — N950 Postmenopausal bleeding: Secondary | ICD-10-CM

## 2024-10-17 NOTE — Progress Notes (Signed)
 GYN US : TA and TV imaging performed - vinyl probe cover used - Chaperone: Peggy Anteverted uterus normal in size, inhomogeneous myometrium, ? Small intramural fibroids anterior fundal myometrium < = 8 mm.  Assymetric myometrium - Anterior myometrium appears thicker than posterior myometrium. Endom thickness = 1.9 mm, uniform avascular cavity and canal, no evidence of intracavitary defects The Right ovary appears normal,  The Left ovary is not seen.  Rt ov appears mobile, faint blood flow to Rt ov (menopausal), neg adnexal regions, neg CDS, no free fluid present

## 2024-10-24 ENCOUNTER — Emergency Department (HOSPITAL_COMMUNITY)
Admission: EM | Admit: 2024-10-24 | Discharge: 2024-10-25 | Disposition: A | Attending: Emergency Medicine | Admitting: Emergency Medicine

## 2024-10-24 ENCOUNTER — Encounter (HOSPITAL_COMMUNITY): Payer: Self-pay

## 2024-10-24 ENCOUNTER — Emergency Department (HOSPITAL_COMMUNITY)

## 2024-10-24 ENCOUNTER — Other Ambulatory Visit: Payer: Self-pay

## 2024-10-24 DIAGNOSIS — Y92410 Unspecified street and highway as the place of occurrence of the external cause: Secondary | ICD-10-CM | POA: Insufficient documentation

## 2024-10-24 DIAGNOSIS — I10 Essential (primary) hypertension: Secondary | ICD-10-CM | POA: Insufficient documentation

## 2024-10-24 DIAGNOSIS — N644 Mastodynia: Secondary | ICD-10-CM | POA: Insufficient documentation

## 2024-10-24 LAB — I-STAT CHEM 8, ED
BUN: 14 mg/dL (ref 6–20)
Calcium, Ion: 1.13 mmol/L — ABNORMAL LOW (ref 1.15–1.40)
Chloride: 101 mmol/L (ref 98–111)
Creatinine, Ser: 0.9 mg/dL (ref 0.44–1.00)
Glucose, Bld: 111 mg/dL — ABNORMAL HIGH (ref 70–99)
HCT: 44 % (ref 36.0–46.0)
Hemoglobin: 15 g/dL (ref 12.0–15.0)
Potassium: 3.9 mmol/L (ref 3.5–5.1)
Sodium: 138 mmol/L (ref 135–145)
TCO2: 26 mmol/L (ref 22–32)

## 2024-10-24 MED ORDER — IOHEXOL 350 MG/ML SOLN
75.0000 mL | Freq: Once | INTRAVENOUS | Status: AC | PRN
  Administered 2024-10-24: 75 mL via INTRAVENOUS
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
## 2024-10-25 MED ORDER — METHOCARBAMOL 500 MG PO TABS
500.0000 mg | ORAL_TABLET | Freq: Once | ORAL | Status: AC
  Administered 2024-10-25: 500 mg via ORAL
  Filled 2024-10-25: qty 1

## 2024-10-25 MED ORDER — KETOROLAC TROMETHAMINE 15 MG/ML IJ SOLN
15.0000 mg | Freq: Once | INTRAMUSCULAR | Status: AC
  Administered 2024-10-25: 15 mg via INTRAVENOUS
  Filled 2024-10-25: qty 1

## 2024-10-25 MED ORDER — METHOCARBAMOL 500 MG PO TABS: 500.0000 mg | ORAL_TABLET | Freq: Two times a day (BID) | ORAL | 0 refills | Status: AC
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
## 2024-12-18 ENCOUNTER — Ambulatory Visit: Admitting: Family Medicine
